# Patient Record
Sex: Female | Born: 1990 | Race: Black or African American | Hispanic: No | Marital: Single | Smoking: Former smoker
Health system: Southern US, Community
[De-identification: ages and names within clinical notes are randomized; demographics above are authoritative.]

## PROBLEM LIST (undated history)

## (undated) DIAGNOSIS — R87629 Unspecified abnormal cytological findings in specimens from vagina: Secondary | ICD-10-CM

## (undated) DIAGNOSIS — O139 Gestational [pregnancy-induced] hypertension without significant proteinuria, unspecified trimester: Secondary | ICD-10-CM

## (undated) HISTORY — PX: NO PAST SURGERIES: SHX2092

## (undated) HISTORY — DX: Unspecified abnormal cytological findings in specimens from vagina: R87.629

## (undated) HISTORY — DX: Gestational (pregnancy-induced) hypertension without significant proteinuria, unspecified trimester: O13.9

---
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
## 2010-06-18 LAB — POCT URINALYSIS DIP (DEVICE)
Bilirubin Urine: NEGATIVE
Glucose, UA: NEGATIVE mg/dL
Ketones, ur: NEGATIVE mg/dL
pH: 7 (ref 5.0–8.0)

## 2011-12-18 ENCOUNTER — Encounter (HOSPITAL_COMMUNITY): Payer: Self-pay | Admitting: Physical Medicine and Rehabilitation

## 2011-12-18 ENCOUNTER — Emergency Department (HOSPITAL_COMMUNITY)
Admission: EM | Admit: 2011-12-18 | Discharge: 2011-12-18 | Disposition: A | Discharge status: Home / Self Care | Payer: Self-pay | Attending: Emergency Medicine | Admitting: Emergency Medicine

## 2011-12-18 DIAGNOSIS — B9689 Other specified bacterial agents as the cause of diseases classified elsewhere: Secondary | ICD-10-CM | POA: Insufficient documentation

## 2011-12-18 DIAGNOSIS — N898 Other specified noninflammatory disorders of vagina: Secondary | ICD-10-CM | POA: Insufficient documentation

## 2011-12-18 DIAGNOSIS — A499 Bacterial infection, unspecified: Secondary | ICD-10-CM | POA: Insufficient documentation

## 2011-12-18 DIAGNOSIS — N939 Abnormal uterine and vaginal bleeding, unspecified: Secondary | ICD-10-CM

## 2011-12-18 DIAGNOSIS — N760 Acute vaginitis: Secondary | ICD-10-CM | POA: Insufficient documentation

## 2011-12-18 LAB — URINALYSIS, ROUTINE W REFLEX MICROSCOPIC
Leukocytes, UA: NEGATIVE
Nitrite: NEGATIVE
Specific Gravity, Urine: 1.027 (ref 1.005–1.030)
Urobilinogen, UA: 0.2 mg/dL (ref 0.0–1.0)

## 2011-12-18 LAB — POCT PREGNANCY, URINE: Preg Test, Ur: NEGATIVE

## 2011-12-18 LAB — WET PREP, GENITAL

## 2011-12-18 LAB — URINE MICROSCOPIC-ADD ON

## 2011-12-18 MED ORDER — METRONIDAZOLE 500 MG PO TABS: 500.0000 mg | ORAL_TABLET | Freq: Two times a day (BID) | ORAL | Status: DC

## 2011-12-18 NOTE — ED Notes (Signed)
Pt presents to department for evaluation of lower abdominal pain and vaginal bleeding. Ongoing x4 days. States vaginal bleeding with intercourse and spotting between periods. Denies pain at the time. Denies urinary symptoms. She is alert and oriented x4. No signs of acute distress at the time.

## 2011-12-18 NOTE — ED Provider Notes (Signed)
History     CSN: [id number]  Arrival date & time 12/18/11  1040   First MD Initiated Contact with Patient 12/18/11 1402      Chief Complaint  Patient presents with  . Abdominal Pain  . Vaginal Bleeding    (Consider location/radiation/quality/duration/timing/severity/associated sxs/prior treatment) HPI Comments: Pt with intermittent vaginal bleeding associated with urination or sexual intercourse.   Patient is a 21 y.o. female presenting with vaginal bleeding. The history is provided by the patient.  Vaginal Bleeding This is a new problem. The current episode started 1 to 4 weeks ago. The problem occurs 2 to 4 times per day. The problem has been unchanged. Pertinent negatives include no abdominal pain, anorexia, change in bowel habit, chest pain, coughing, fatigue, fever, headaches or nausea. Nothing aggravates the symptoms. She has tried nothing for the symptoms. The treatment provided no relief.    No past medical history on file.  No past surgical history on file.  No family history on file.  History  Substance Use Topics  . Smoking status: Never Smoker   . Smokeless tobacco: Not on file  . Alcohol Use: No    OB History    Grav Para Term Preterm Abortions TAB SAB Ect Mult Living                  Review of Systems  Constitutional: Negative for fever and fatigue.  Respiratory: Negative for cough and shortness of breath.   Cardiovascular: Negative for chest pain.  Gastrointestinal: Negative for nausea, abdominal pain, diarrhea, anorexia and change in bowel habit.  Genitourinary: Positive for vaginal bleeding and menstrual problem (missed period last month). Negative for vaginal discharge, vaginal pain and pelvic pain.  Neurological: Negative for headaches.  All other systems reviewed and are negative.    Allergies  Review of patient's allergies indicates no known allergies.  Home Medications  No current outpatient prescriptions on file.  BP 120/80  Pulse 60   Temp 98.1 F (36.7 C) (Oral)  Resp 16  SpO2 100%  Physical Exam  Nursing note and vitals reviewed. Constitutional: She is oriented to person, place, and time. She appears well-developed and well-nourished. No distress.  HENT:  Head: Normocephalic and atraumatic.  Eyes: EOM are normal. Pupils are equal, round, and reactive to light.  Neck: Normal range of motion.  Cardiovascular: Normal rate and normal heart sounds.   Pulmonary/Chest: Effort normal and breath sounds normal. No respiratory distress.  Abdominal: Soft. She exhibits no distension. There is no tenderness.  Genitourinary: There is bleeding (dark blood, appears old) around the vagina. No foreign body around the vagina. No signs of injury around the vagina.  Musculoskeletal: Normal range of motion.  Neurological: She is alert and oriented to person, place, and time.  Skin: Skin is warm and dry.    ED Course  Procedures (including critical care time)  Labs Reviewed  URINALYSIS, ROUTINE W REFLEX MICROSCOPIC - Abnormal; Notable for the following:    Hgb urine dipstick MODERATE (*)     All other components within normal limits  URINE MICROSCOPIC-ADD ON - Abnormal; Notable for the following:    Squamous Epithelial / LPF FEW (*)     All other components within normal limits  WET PREP, GENITAL - Abnormal; Notable for the following:    Clue Cells Wet Prep HPF POC MANY (*)     WBC, Wet Prep HPF POC MANY (*)     All other components within normal limits  POCT PREGNANCY, URINE  GC/CHLAMYDIA PROBE AMP, GENITAL   No results found.   1. Vaginal bleeding   2. Bacterial vaginosis       MDM  2:56 PM Pt seen and examined. Pt with intermittent spotting after missing her period last month. Pt denies pain or other symptoms. Pelvic without laceration or other obvious source for bleeding, though dark blood in vagina. Will send wet prep. Pt is not pregnant at this time.    4:01 PM Wet prep shows BV. Will treat. Pt advised to  follow with GYN to discuss her abnormal bleeding.     Daleen Bo, MD 12/18/11 [phone]

## 2011-12-18 NOTE — ED Notes (Signed)
Pt reporting last normal period was in august, has been spotting since then. Has had intermittent abdominal pain. Denying any pain at this time. A x 4. Reporting came in today to be evaluated for spotting and "abnormal periods".

## 2011-12-19 LAB — GC/CHLAMYDIA PROBE AMP, GENITAL
Chlamydia, DNA Probe: NEGATIVE
GC Probe Amp, Genital: NEGATIVE

## 2011-12-19 NOTE — ED Provider Notes (Signed)
I saw and evaluated the patient, reviewed the resident's note and I agree with the findings and plan.  Dysfunctional bleeding. Not pregnant. Abdomen benign. Home with tx for her BV  1. Vaginal bleeding   2. Bacterial vaginosis      Lyanne Co, MD 12/19/11 [phone]

## 2012-07-03 ENCOUNTER — Emergency Department (HOSPITAL_COMMUNITY)
Admission: EM | Admit: 2012-07-03 | Discharge: 2012-07-03 | Discharge status: Against Medical Advice | Payer: Self-pay | Attending: Emergency Medicine | Admitting: Emergency Medicine

## 2012-07-03 ENCOUNTER — Encounter (HOSPITAL_COMMUNITY): Payer: Self-pay

## 2012-07-03 DIAGNOSIS — R109 Unspecified abdominal pain: Secondary | ICD-10-CM | POA: Insufficient documentation

## 2012-07-03 DIAGNOSIS — Z3202 Encounter for pregnancy test, result negative: Secondary | ICD-10-CM | POA: Insufficient documentation

## 2012-07-03 DIAGNOSIS — N939 Abnormal uterine and vaginal bleeding, unspecified: Secondary | ICD-10-CM | POA: Insufficient documentation

## 2012-07-03 DIAGNOSIS — N926 Irregular menstruation, unspecified: Secondary | ICD-10-CM | POA: Insufficient documentation

## 2012-07-03 DIAGNOSIS — R112 Nausea with vomiting, unspecified: Secondary | ICD-10-CM | POA: Insufficient documentation

## 2012-07-03 DIAGNOSIS — F17200 Nicotine dependence, unspecified, uncomplicated: Secondary | ICD-10-CM | POA: Insufficient documentation

## 2012-07-03 LAB — CBC WITH DIFFERENTIAL/PLATELET
Basophils Absolute: 0 10*3/uL (ref 0.0–0.1)
Basophils Relative: 0 % (ref 0–1)
Eosinophils Relative: 1 % (ref 0–5)
Lymphocytes Relative: 22 % (ref 12–46)
MCHC: 36.3 g/dL — ABNORMAL HIGH (ref 30.0–36.0)
Monocytes Absolute: 0.4 10*3/uL (ref 0.1–1.0)
Neutro Abs: 4.2 10*3/uL (ref 1.7–7.7)
Platelets: 235 10*3/uL (ref 150–400)
RDW: 12.4 % (ref 11.5–15.5)
WBC: 6 10*3/uL (ref 4.0–10.5)

## 2012-07-03 LAB — COMPREHENSIVE METABOLIC PANEL
ALT: 13 U/L (ref 0–35)
AST: 20 U/L (ref 0–37)
Albumin: 3.8 g/dL (ref 3.5–5.2)
CO2: 27 mEq/L (ref 19–32)
Calcium: 9.6 mg/dL (ref 8.4–10.5)
Chloride: 103 mEq/L (ref 96–112)
Creatinine, Ser: 0.86 mg/dL (ref 0.50–1.10)
GFR calc non Af Amer: >90 mL/min (ref >90)
Sodium: 139 mEq/L (ref 135–145)

## 2012-07-03 LAB — URINALYSIS, ROUTINE W REFLEX MICROSCOPIC
Ketones, ur: NEGATIVE mg/dL
Leukocytes, UA: NEGATIVE
Protein, ur: NEGATIVE mg/dL
Urobilinogen, UA: 0.2 mg/dL (ref 0.0–1.0)

## 2012-07-03 LAB — WET PREP, GENITAL
Clue Cells Wet Prep HPF POC: NONE SEEN
Trich, Wet Prep: NONE SEEN

## 2012-07-03 MED ORDER — IBUPROFEN 800 MG PO TABS
800.0000 mg | ORAL_TABLET | Freq: Once | ORAL | Status: AC
  Administered 2012-07-03: 800 mg via ORAL
  Filled 2012-07-03: qty 1

## 2012-07-03 NOTE — ED Notes (Signed)
PA at bedside.

## 2012-07-03 NOTE — ED Provider Notes (Signed)
Medical screening examination/treatment/procedure(s) were performed by non-physician practitioner and as supervising physician I was immediately available for consultation/collaboration.   Lyanne Co, MD 07/03/12 [phone]

## 2012-07-03 NOTE — ED Provider Notes (Signed)
History     CSN: [id number]  Arrival date & time 07/03/12  1120   First MD Initiated Contact with Patient 07/03/12 1132      Chief Complaint  Patient presents with  . Abdominal Pain    (Consider location/radiation/quality/duration/timing/severity/associated sxs/prior treatment) HPI Comments: 22 year old female no significant past medical history presents emergency department complaining of gradual onset lower abdominal pain x2 days. Patient states last night after eating [place] food she began having non-radiating sharp lower abdominal pain rated 10/10. Admits to associated nausea with one episode of vomiting. After vomiting nausea subsided. She has not tried any alleviating factors for her pain, however over the night pain decreased to 5/10. Denies vaginal bleeding, discharge or pain, dysuria, hematuria, increased frequency or urgency, fever or chills. Last menstrual period was almost 2 months ago. She is not on any birth control pills and is sexually active with one partner.  Patient is a 22 y.o. female presenting with abdominal pain. The history is provided by the patient.  Abdominal Pain Associated symptoms: nausea and vomiting   Associated symptoms: no chills, no dysuria, no fever, no vaginal bleeding and no vaginal discharge     History reviewed. No pertinent past medical history.  History reviewed. No pertinent past surgical history.  No family history on file.  History  Substance Use Topics  . Smoking status: Current Some Day Smoker  . Smokeless tobacco: Not on file  . Alcohol Use: Yes     Comment: Occasional    OB History   Grav Para Term Preterm Abortions TAB SAB Ect Mult Living                  Review of Systems  Constitutional: Negative for fever, chills and appetite change.  Gastrointestinal: Positive for nausea, vomiting and abdominal pain.  Genitourinary: Positive for menstrual problem. Negative for dysuria, urgency, frequency, flank pain, decreased urine  volume, vaginal bleeding, vaginal discharge, vaginal pain and pelvic pain.  Musculoskeletal: Negative for back pain.  All other systems reviewed and are negative.    Allergies  Review of patient's allergies indicates no known allergies.  Home Medications   Current Outpatient Rx  Name  Route  Sig  Dispense  Refill  . metroNIDAZOLE (FLAGYL) 500 MG tablet   Oral   Take 1 tablet (500 mg total) by mouth 2 (two) times daily.   14 tablet   0     BP 138/68  Pulse 92  Temp(Src) 98.6 F (37 C) (Oral)  Resp 18  SpO2 96%  LMP 05/10/2012  Physical Exam  Nursing note and vitals reviewed. Constitutional: She is oriented to person, place, and time. She appears well-developed and well-nourished. No distress.  HENT:  Head: Normocephalic and atraumatic.  Mouth/Throat: Oropharynx is clear and moist.  Eyes: Conjunctivae are normal.  Neck: Normal range of motion. Neck supple.  Cardiovascular: Normal rate, regular rhythm and normal heart sounds.   Pulmonary/Chest: Effort normal and breath sounds normal. No respiratory distress.  Abdominal: Soft. Normal appearance and bowel sounds are normal. She exhibits no distension and no mass. There is tenderness in the suprapubic area and left lower quadrant. There is no rigidity, no rebound, no guarding and no CVA tenderness.  Genitourinary: Uterus normal. Uterus is not deviated, not enlarged, not fixed and not tender. Cervix exhibits discharge (clear). Cervix exhibits no motion tenderness and no friability. Right adnexum displays no mass, no tenderness and no fullness. Left adnexum displays tenderness. Left adnexum displays no mass and no fullness.  No erythema, tenderness or bleeding around the vagina. Vaginal discharge (clear) found.  Musculoskeletal: Normal range of motion. She exhibits no edema.  Neurological: She is alert and oriented to person, place, and time.  Skin: Skin is warm and dry. She is not diaphoretic.  Psychiatric: She has a normal mood  and affect. Her behavior is normal.    ED Course  Procedures (including critical care time)  Labs Reviewed  WET PREP, GENITAL - Abnormal; Notable for the following:    WBC, Wet Prep HPF POC TOO NUMEROUS TO COUNT (*)    All other components within normal limits  URINALYSIS, ROUTINE W REFLEX MICROSCOPIC - Abnormal; Notable for the following:    APPearance CLOUDY (*)    All other components within normal limits  CBC WITH DIFFERENTIAL - Abnormal; Notable for the following:    HCT 35.3 (*)    MCHC 36.3 (*)    All other components within normal limits  GC/CHLAMYDIA PROBE AMP  COMPREHENSIVE METABOLIC PANEL  POCT PREGNANCY, URINE   No results found.   1. Abdominal pain       MDM  Abdominal pain, LLQ, L adnexal tenderness. Probable ovarian cyst. Pelvic ultrasound ordered, however patient left AMA prior to ultrasound being obtained. Throughout entire evaluation patient was texting in NAD. Return precautions given prior to leaving.   Trevor Mace, PA-C 07/03/12 1421

## 2012-07-03 NOTE — ED Notes (Signed)
Pt presents to ER with c/o lower abdominal pain that began last night. She says she ate something last night that she doesn't normally eat. No urinary symptoms. Vomited once last night.

## 2012-07-04 LAB — GC/CHLAMYDIA PROBE AMP
CT Probe RNA: NEGATIVE
GC Probe RNA: NEGATIVE

## 2012-07-29 ENCOUNTER — Emergency Department (HOSPITAL_COMMUNITY)
Admission: EM | Admit: 2012-07-29 | Discharge: 2012-07-29 | Disposition: A | Discharge status: Home / Self Care | Payer: Self-pay | Attending: Emergency Medicine | Admitting: Emergency Medicine

## 2012-07-29 ENCOUNTER — Encounter (HOSPITAL_COMMUNITY): Payer: Self-pay | Admitting: Emergency Medicine

## 2012-07-29 DIAGNOSIS — F17200 Nicotine dependence, unspecified, uncomplicated: Secondary | ICD-10-CM | POA: Insufficient documentation

## 2012-07-29 DIAGNOSIS — N61 Mastitis without abscess: Secondary | ICD-10-CM | POA: Insufficient documentation

## 2012-07-29 MED ORDER — CEPHALEXIN 500 MG PO CAPS: 500.0000 mg | ORAL_CAPSULE | Freq: Four times a day (QID) | ORAL | Status: DC

## 2012-07-29 NOTE — ED Provider Notes (Signed)
History  This chart was scribed for non-physician practitioner Dierdre Forth, PA-C working with Dione Booze, MD, by Candelaria Stagers, ED Scribe. This patient was seen in room TR09C/TR09C and the patient's care was started at 6:11 PM   CSN: [id number]  Arrival date & time 07/29/12  1725   First MD Initiated Contact with Patient 07/29/12 1810      Chief Complaint  Patient presents with  . Breast Mass    The history is provided by the patient. No language interpreter was used.   HPI Comments: Felicia Edwards is a 22 y.o. female who presents to the Emergency Department complaining of a sore knot to the left nipple that she noticed three days ago.  Pt reports the knot has somewhat decreased in size, but is painful.  She states she has been using a heating pad which has helped with the pain and she believes the mass is decreasing in size.  Nothing seems to make it worse.  Pt denies currently being pregnant and is not breast feeding at this time.  Her OB/GYN is not in the area.  She denies fever, chills, nausea, vomiting, or drainage from the area.     History reviewed. No pertinent past medical history.  History reviewed. No pertinent past surgical history.  History reviewed. No pertinent family history.  History  Substance Use Topics  . Smoking status: Current Some Day Smoker  . Smokeless tobacco: Not on file  . Alcohol Use: Yes     Comment: Occasional    OB History   Grav Para Term Preterm Abortions TAB SAB Ect Mult Living                  Review of Systems  Skin:       Sore knot to the left nipple  All other systems reviewed and are negative.    Allergies  Review of patient's allergies indicates no known allergies.  Home Medications   Current Outpatient Rx  Name  Route  Sig  Dispense  Refill  . cephALEXin (KEFLEX) 500 MG capsule   Oral   Take 1 capsule (500 mg total) by mouth 4 (four) times daily.   40 capsule   0     BP 128/75  Pulse 85  Temp(Src)  98.2 F (36.8 C) (Oral)  Resp 16  SpO2 99%  LMP 05/10/2012  Physical Exam  Nursing note and vitals reviewed. Constitutional: She appears well-developed and well-nourished. No distress.  HENT:  Head: Normocephalic and atraumatic.  Mouth/Throat: Oropharynx is clear and moist. No oropharyngeal exudate.  Eyes: Conjunctivae are normal. No scleral icterus.  Neck: Normal range of motion. Neck supple.  Cardiovascular: Normal rate, regular rhythm and intact distal pulses.   Pulmonary/Chest: Effort normal and breath sounds normal. No respiratory distress. She has no wheezes.  Abdominal: Soft. Bowel sounds are normal. She exhibits no mass. There is no tenderness. There is no rebound and no guarding.  Musculoskeletal: Normal range of motion. She exhibits no edema.  Lymphadenopathy:       Right cervical: No superficial cervical, no deep cervical and no posterior cervical adenopathy present.      Left cervical: No superficial cervical, no deep cervical and no posterior cervical adenopathy present.    She has no axillary adenopathy.       Right axillary: No pectoral and no lateral adenopathy present.       Left axillary: No pectoral and no lateral adenopathy present.      Right: No  supraclavicular adenopathy present.       Left: No supraclavicular adenopathy present.  Neurological: She is alert.  Speech is clear and goal oriented Moves extremities without ataxia  Skin: Skin is warm and dry. She is not diaphoretic.  Palpable induration to the medial side of the left breast located in the region of the areola not extending deep into the breast tissue or into the nipple.  No erythema.  Some increased warmth to the area.  NO area of fluctuance  Psychiatric: She has a normal mood and affect.    ED Course  Procedures   DIAGNOSTIC STUDIES: Oxygen Saturation is 99% on room air, normal by my interpretation.    COORDINATION OF CARE:  6:14 PM Discussed course of care with pt which includes antibiotic  and will provide resources for OBGYN follow up.  Strict return precautions discussed.  Pt understands and agrees.   Labs Reviewed - No data to display No results found.   1. Mastitis in female       MDM  Felicia Edwards presents with history and physical consistent with mastitis that the patient is not breast-feeding. No area of fluctuance for which I believe I&D would be beneficial. No axillary lymphadenopathy palpable, afebrile, not tachycardic, nontoxic, nonseptic appearing.  Patient has no history of diabetes, HIV or steroid use.  Patient will be treated with Keflex and encouraged to continue warm compresses. Also recommended that she followup with OB/GYN for reevaluation in one week. I have also discussed reasons to return immediately to the ER.  Patient expresses understanding and agrees with plan.  I personally performed the services described in this documentation, which was scribed in my presence. The recorded information has been reviewed and is accurate.   Dierdre Forth, PA-C 07/29/12 1839

## 2012-07-29 NOTE — ED Notes (Signed)
Pt sts "knot" on left nipple with pain x 3 days

## 2012-07-30 NOTE — ED Provider Notes (Signed)
Medical screening examination/treatment/procedure(s) were performed by non-physician practitioner and as supervising physician I was immediately available for consultation/collaboration.   Dione Booze, MD 07/30/12 [phone]

## 2012-10-09 ENCOUNTER — Emergency Department (HOSPITAL_COMMUNITY)
Admission: EM | Admit: 2012-10-09 | Discharge: 2012-10-09 | Disposition: A | Discharge status: Home / Self Care | Payer: Self-pay | Attending: Emergency Medicine | Admitting: Emergency Medicine

## 2012-10-09 ENCOUNTER — Encounter (HOSPITAL_COMMUNITY): Payer: Self-pay | Admitting: Emergency Medicine

## 2012-10-09 DIAGNOSIS — Z3202 Encounter for pregnancy test, result negative: Secondary | ICD-10-CM | POA: Insufficient documentation

## 2012-10-09 DIAGNOSIS — F17200 Nicotine dependence, unspecified, uncomplicated: Secondary | ICD-10-CM | POA: Insufficient documentation

## 2012-10-09 DIAGNOSIS — R300 Dysuria: Secondary | ICD-10-CM | POA: Insufficient documentation

## 2012-10-09 DIAGNOSIS — A6000 Herpesviral infection of urogenital system, unspecified: Secondary | ICD-10-CM | POA: Insufficient documentation

## 2012-10-09 LAB — URINALYSIS, ROUTINE W REFLEX MICROSCOPIC
Bilirubin Urine: NEGATIVE
Glucose, UA: NEGATIVE mg/dL
Hgb urine dipstick: NEGATIVE
Ketones, ur: NEGATIVE mg/dL
Nitrite: NEGATIVE
Protein, ur: NEGATIVE mg/dL
Specific Gravity, Urine: 1.026 (ref 1.005–1.030)
Urobilinogen, UA: 1 mg/dL (ref 0.0–1.0)
pH: 7 (ref 5.0–8.0)

## 2012-10-09 LAB — URINE MICROSCOPIC-ADD ON

## 2012-10-09 MED ORDER — VALACYCLOVIR HCL 1 G PO TABS: 1000.0000 mg | ORAL_TABLET | Freq: Two times a day (BID) | ORAL | Status: AC

## 2012-10-09 NOTE — ED Notes (Signed)
PT. REPORTS VAGINAL IRRITATION /ITCHING AFTER APPLYING FRAGRANCE POWDER AFTER SHAVING , ALSO REPORTS " BURNING " WHEN URINATING .

## 2012-10-09 NOTE — ED Provider Notes (Signed)
CSN: [id number]     Arrival date & time 10/09/12  1910 History  This chart was scribed for non-physician practitioner Felicia Forth, PA-C, working with Felicia Edwards. Felicia Lamas, MD, by Felicia Edwards, ED Scribe. This patient was seen in room TR11C/TR11C and the patient's care was started at 9:36 PM.   First MD Initiated Contact with Patient 10/09/12 2111     Chief Complaint  Patient presents with  . Vaginal Itching    The history is provided by the patient. No language interpreter was used.   HPI Comments: Felicia Edwards is a 22 y.o. female who presents to the Emergency Department complaining of irritation to her vagina which began a week ago. She states that the symptoms worsened two days ago. Last week she applied a fragrance powder after shaving.  She reports that she normally shaves, but the fragrance powder was new. The pt reports experiencing vaginal itching and dysuria as associated symptoms. She denies experiencing any vaginal discharge and vaginal bleeding. She also denies experiencing any fever, chills, nausea, or emesis.  She has a h/o STD including chlamydia for which she has been treated. The pt states she has had two sexual partners within the last six months. The pt denies taking any medication on a daily basis.   History reviewed. No pertinent past medical history. History reviewed. No pertinent past surgical history. No family history on file. History  Substance Use Topics  . Smoking status: Current Some Day Smoker  . Smokeless tobacco: Not on file  . Alcohol Use: Yes     Comment: Occasional    No OB history provided.  Review of Systems  Constitutional: Negative for fever and chills.  Gastrointestinal: Negative for nausea and vomiting.  Genitourinary: Positive for dysuria (Burning.). Negative for vaginal bleeding and vaginal discharge.       Vaginal itching.   All other systems reviewed and are negative.    Allergies  Review of patient's allergies indicates no  known allergies.  Home Medications   Current Outpatient Rx  Name  Route  Sig  Dispense  Refill  . valACYclovir (VALTREX) 1000 MG tablet   Oral   Take 1 tablet (1,000 mg total) by mouth 2 (two) times daily.   20 tablet   0     Triage Vitals: BP 144/85  Pulse 105  Temp(Src) 99.3 F (37.4 C) (Oral)  Resp 14  SpO2 100%  LMP 09/24/2012  Physical Exam  Nursing note and vitals reviewed. Constitutional: She appears well-developed and well-nourished. No distress.  Awake, alert, nontoxic appearance  HENT:  Head: Normocephalic and atraumatic.  Mouth/Throat: Oropharynx is clear and moist. No oropharyngeal exudate.  Eyes: Conjunctivae are normal. No scleral icterus.  Neck: Normal range of motion. Neck supple.  Cardiovascular: Normal rate, regular rhythm and intact distal pulses.   Pulmonary/Chest: Effort normal and breath sounds normal. No respiratory distress. She has no wheezes.  Abdominal: Soft. Bowel sounds are normal. She exhibits no mass. There is no tenderness. There is no rebound and no guarding.  Genitourinary:    There is rash, tenderness and lesion on the right labia. There is no injury on the right labia. There is rash, tenderness and lesion on the left labia. There is no injury on the left labia.  Multiple ulcerated lesions of the vulva with erythema and significant tenderness to palpation, no induration or fluctuance, no evidence of abscess No urticaria  Musculoskeletal: Normal range of motion. She exhibits no edema.  Lymphadenopathy:  Right: Inguinal adenopathy present.       Left: Inguinal adenopathy present.  Bilateral mild inguinal adenopathy  Neurological: She is alert.  Speech is clear and goal oriented Moves extremities without ataxia  Skin: Skin is warm and dry. She is not diaphoretic.  Psychiatric: She has a normal mood and affect.    ED Course   DIAGNOSTIC STUDIES: Oxygen Saturation is 100% on room air, normal by my interpretation.     COORDINATION OF CARE:  9:40 PM-Discussed treatment plan with patient which includes a prescription for Valtrex for herpes and education about herpes, and the patient agreed to the plan.   Procedures (including critical care time)  Labs Reviewed  URINALYSIS, ROUTINE W REFLEX MICROSCOPIC - Abnormal; Notable for the following:    Leukocytes, UA SMALL (*)    All other components within normal limits  URINE MICROSCOPIC-ADD ON - Abnormal; Notable for the following:    Squamous Epithelial / LPF FEW (*)    Bacteria, UA FEW (*)    All other components within normal limits  URINE CULTURE  WET PREP, GENITAL  GC/CHLAMYDIA PROBE AMP  POCT PREGNANCY, URINE   No results found. 1. 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  Urinalysis without evidence of urinary tract infection and pregnancy test negative. No pallor performed as patient does not have vaginal discharge or bleeding.  Patient's consistent with genital herpes. We'll treat with Valtrex and recommend followup with OB/GYN.  I have also discussed reasons to return immediately to the ER.  Patient expresses understanding and agrees with plan.  I personally performed the services described in this documentation, which was scribed in my presence. The recorded information has been reviewed and is accurate.   Felicia Client Larren Copes, PA-C 10/09/12 2211

## 2012-10-10 NOTE — ED Provider Notes (Signed)
Medical screening examination/treatment/procedure(s) were performed by non-physician practitioner and as supervising physician I was immediately available for consultation/collaboration.   Felicia Edwards. Trevelle Mcgurn, MD 10/10/12 1454

## 2012-10-11 LAB — URINE CULTURE: Colony Count: NO GROWTH

## 2014-08-26 ENCOUNTER — Encounter (HOSPITAL_COMMUNITY): Payer: Self-pay | Admitting: Family Medicine

## 2014-08-26 ENCOUNTER — Emergency Department (HOSPITAL_COMMUNITY)
Admission: EM | Admit: 2014-08-26 | Discharge: 2014-08-26 | Disposition: A | Discharge status: Home / Self Care | Payer: Self-pay | Attending: Emergency Medicine | Admitting: Emergency Medicine

## 2014-08-26 DIAGNOSIS — Z720 Tobacco use: Secondary | ICD-10-CM | POA: Insufficient documentation

## 2014-08-26 DIAGNOSIS — A599 Trichomoniasis, unspecified: Secondary | ICD-10-CM

## 2014-08-26 DIAGNOSIS — A5901 Trichomonal vulvovaginitis: Secondary | ICD-10-CM | POA: Insufficient documentation

## 2014-08-26 DIAGNOSIS — Z3202 Encounter for pregnancy test, result negative: Secondary | ICD-10-CM | POA: Insufficient documentation

## 2014-08-26 DIAGNOSIS — N939 Abnormal uterine and vaginal bleeding, unspecified: Secondary | ICD-10-CM

## 2014-08-26 LAB — CBC WITH DIFFERENTIAL/PLATELET
BASOS PCT: 0 % (ref 0–1)
Basophils Absolute: 0 10*3/uL (ref 0.0–0.1)
Eosinophils Absolute: 0.1 10*3/uL (ref 0.0–0.7)
Eosinophils Relative: 2 % (ref 0–5)
HEMATOCRIT: 36.9 % (ref 36.0–46.0)
HEMOGLOBIN: 12.8 g/dL (ref 12.0–15.0)
LYMPHS ABS: 1.8 10*3/uL (ref 0.7–4.0)
LYMPHS PCT: 47 % — AB (ref 12–46)
MCH: 31.4 pg (ref 26.0–34.0)
MCHC: 34.7 g/dL (ref 30.0–36.0)
MCV: 90.7 fL (ref 78.0–100.0)
MONO ABS: 0.3 10*3/uL (ref 0.1–1.0)
MONOS PCT: 9 % (ref 3–12)
NEUTROS ABS: 1.6 10*3/uL — AB (ref 1.7–7.7)
NEUTROS PCT: 42 % — AB (ref 43–77)
Platelets: 200 10*3/uL (ref 150–400)
RBC: 4.07 MIL/uL (ref 3.87–5.11)
RDW: 12.4 % (ref 11.5–15.5)
WBC: 3.8 10*3/uL — ABNORMAL LOW (ref 4.0–10.5)

## 2014-08-26 LAB — URINALYSIS, ROUTINE W REFLEX MICROSCOPIC
BILIRUBIN URINE: NEGATIVE
GLUCOSE, UA: NEGATIVE mg/dL
Ketones, ur: NEGATIVE mg/dL
LEUKOCYTES UA: NEGATIVE
NITRITE: NEGATIVE
Protein, ur: NEGATIVE mg/dL
SPECIFIC GRAVITY, URINE: 1.031 — AB (ref 1.005–1.030)
UROBILINOGEN UA: 0.2 mg/dL (ref 0.0–1.0)
pH: 6 (ref 5.0–8.0)

## 2014-08-26 LAB — URINE MICROSCOPIC-ADD ON

## 2014-08-26 LAB — WET PREP, GENITAL: Yeast Wet Prep HPF POC: NONE SEEN

## 2014-08-26 LAB — POC URINE PREG, ED: Preg Test, Ur: NEGATIVE

## 2014-08-26 MED ORDER — METRONIDAZOLE 500 MG PO TABS: 500.0000 mg | ORAL_TABLET | Freq: Two times a day (BID) | ORAL | Status: DC

## 2014-08-26 NOTE — ED Provider Notes (Signed)
CSN: [id number]     Arrival date & time 08/26/14  0747 History   First MD Initiated Contact with Patient 08/26/14 [phone]     Chief Complaint  Patient presents with  . Abdominal Pain  . Vaginal Bleeding     (Consider location/radiation/quality/duration/timing/severity/associated sxs/prior Treatment) HPI Comments: Patient presents today with a chief complaint of vaginal bleeding.  She describes the bleeding as spotting.  Bleeding has been occurring over the past week.  She has a history of the same and was seen in the ED in 2013 for similar presentation.  She also reports mild lower abdominal cramping intermittently over the past week.  No abdominal pain or cramping at this time.  She denies nausea, vomiting, fever, chills, dizziness, lightheadedness, syncope, chest pain, or SOB.  She also denies any urinary symptoms.  She is sexually active.  Denies any recent trauma.  She is currently not on any contraceptives.  She does not have an OB/GYN.  She reports that her last menstrual period was mid May, but states that this bleeding is different than her normal menstrual bleeding.  Patient is a 24 y.o. female presenting with abdominal pain and vaginal bleeding. The history is provided by the patient.  Abdominal Pain Associated symptoms: vaginal bleeding   Vaginal Bleeding Associated symptoms: abdominal pain     History reviewed. No pertinent past medical history. History reviewed. No pertinent past surgical history. History reviewed. No pertinent family history. History  Substance Use Topics  . Smoking status: Current Some Day Smoker    Types: Cigarettes  . Smokeless tobacco: Not on file  . Alcohol Use: Yes     Comment: Twice every two weeks.    OB History    No data available     Review of Systems  Gastrointestinal: Positive for abdominal pain.  Genitourinary: Positive for vaginal bleeding.  All other systems reviewed and are negative.     Allergies  Review of patient's allergies  indicates no known allergies.  Home Medications   Prior to Admission medications   Not on File   BP 147/87 mmHg  Pulse 70  Temp(Src) 98.7 F (37.1 C) (Oral)  Resp 18  Ht  (1.6 m)  Wt 106 lb (48.081 kg)  BMI 18.78 kg/m2  SpO2 100%  LMP  Physical Exam  Constitutional: She appears well-developed and well-nourished.  HENT:  Head: Normocephalic and atraumatic.  Cardiovascular: Normal rate, regular rhythm and normal heart sounds.   Pulmonary/Chest: Effort normal and breath sounds normal.  Abdominal: Soft. Bowel sounds are normal. She exhibits no distension and no mass. There is no tenderness. There is no rebound and no guarding.  Genitourinary: Cervix exhibits no motion tenderness. Right adnexum displays no mass, no tenderness and no fullness. Left adnexum displays no mass, no tenderness and no fullness.  Small amount of dark red blood in the vaginal vault.  No clots.  Musculoskeletal: Normal range of motion.  Neurological: She is alert.  Skin: Skin is warm and dry.  Psychiatric: She has a normal mood and affect.  Nursing note and vitals reviewed.   ED Course  Procedures (including critical care time) Labs Review Labs Reviewed  WET PREP, GENITAL  URINALYSIS, ROUTINE W REFLEX MICROSCOPIC (NOT AT [facility])  CBC WITH DIFFERENTIAL/PLATELET  POC URINE PREG, ED  GC/CHLAMYDIA PROBE AMP ([place]) NOT AT [facility]    Imaging Review No results found.   EKG Interpretation None      MDM   Final diagnoses:  None  Patient presents today with a chief complaint of vaginal bleeding.  She describes the bleeding as spotting for the past week.  VSS.  Labs including hemoglobin unremarkable.  Urine pregnancy is negative.  No CMT or adnexal tenderness with pelvic exam.  Abdomen soft and nontender.  Therefore, do not feel that any imaging is indicated.  Wet prep is positive for Trich.  Patient started on Flagyl.  Stable for discharge.  Given referral to OB/GYN.  Return precautions  given.    Santiago Glad, PA-C 08/26/14 1552  Mancel Bale, MD 08/26/14 [phone]

## 2014-08-26 NOTE — ED Notes (Signed)
Patient states she is experiencing lower abd pain with vaginal bleeding since last Thursday. Denies nausea, vomiting, diarrhea, or fever.

## 2014-08-27 LAB — GC/CHLAMYDIA PROBE AMP (~~LOC~~) NOT AT ARMC
CHLAMYDIA, DNA PROBE: NEGATIVE
NEISSERIA GONORRHEA: NEGATIVE

## 2014-08-28 LAB — URINE CULTURE

## 2014-09-15 ENCOUNTER — Encounter (HOSPITAL_COMMUNITY): Payer: Self-pay

## 2014-09-15 ENCOUNTER — Emergency Department (HOSPITAL_COMMUNITY)
Admission: EM | Admit: 2014-09-15 | Discharge: 2014-09-16 | Disposition: A | Discharge status: Home / Self Care | Payer: Self-pay | Attending: Emergency Medicine | Admitting: Emergency Medicine

## 2014-09-15 DIAGNOSIS — B373 Candidiasis of vulva and vagina: Secondary | ICD-10-CM | POA: Insufficient documentation

## 2014-09-15 DIAGNOSIS — Z3202 Encounter for pregnancy test, result negative: Secondary | ICD-10-CM | POA: Insufficient documentation

## 2014-09-15 DIAGNOSIS — B3731 Acute candidiasis of vulva and vagina: Secondary | ICD-10-CM

## 2014-09-15 DIAGNOSIS — N390 Urinary tract infection, site not specified: Secondary | ICD-10-CM | POA: Insufficient documentation

## 2014-09-15 DIAGNOSIS — Z720 Tobacco use: Secondary | ICD-10-CM | POA: Insufficient documentation

## 2014-09-15 LAB — URINE MICROSCOPIC-ADD ON

## 2014-09-15 LAB — URINALYSIS, ROUTINE W REFLEX MICROSCOPIC
BILIRUBIN URINE: NEGATIVE
Glucose, UA: NEGATIVE mg/dL
HGB URINE DIPSTICK: NEGATIVE
KETONES UR: NEGATIVE mg/dL
NITRITE: NEGATIVE
Protein, ur: NEGATIVE mg/dL
SPECIFIC GRAVITY, URINE: 1.026 (ref 1.005–1.030)
UROBILINOGEN UA: 0.2 mg/dL (ref 0.0–1.0)
pH: 6.5 (ref 5.0–8.0)

## 2014-09-15 LAB — PREGNANCY, URINE: Preg Test, Ur: NEGATIVE

## 2014-09-15 NOTE — ED Notes (Signed)
Pt was seen here two weeks ago and treated for trich, she took all the medication and now she states that she feels irritated and thinks she has a yeast infection, she used a scented soap and body spray

## 2014-09-16 LAB — GC/CHLAMYDIA PROBE AMP (~~LOC~~) NOT AT ARMC
CHLAMYDIA, DNA PROBE: NEGATIVE
Neisseria Gonorrhea: NEGATIVE

## 2014-09-16 LAB — WET PREP, GENITAL
Clue Cells Wet Prep HPF POC: NONE SEEN
Trich, Wet Prep: NONE SEEN

## 2014-09-16 MED ORDER — AZITHROMYCIN 250 MG PO TABS
1000.0000 mg | ORAL_TABLET | Freq: Once | ORAL | Status: AC
  Administered 2014-09-16: 1000 mg via ORAL
  Filled 2014-09-16: qty 4

## 2014-09-16 MED ORDER — CEFTRIAXONE SODIUM 250 MG IJ SOLR
250.0000 mg | Freq: Once | INTRAMUSCULAR | Status: AC
  Administered 2014-09-16: 250 mg via INTRAMUSCULAR
  Filled 2014-09-16: qty 250

## 2014-09-16 MED ORDER — ONDANSETRON 4 MG PO TBDP
4.0000 mg | ORAL_TABLET | Freq: Once | ORAL | Status: AC
  Administered 2014-09-16: 4 mg via ORAL
  Filled 2014-09-16: qty 1

## 2014-09-16 MED ORDER — LIDOCAINE HCL 1 % IJ SOLN
INTRAMUSCULAR | Status: AC
  Filled 2014-09-16: qty 20

## 2014-09-16 MED ORDER — FLUCONAZOLE 200 MG PO TABS: 200.0000 mg | ORAL_TABLET | Freq: Every day | ORAL | Status: AC

## 2014-09-16 NOTE — ED Provider Notes (Signed)
CSN: [id number]     Arrival date & time 09/15/14  1917 History   First MD Initiated Contact with Patient 09/15/14 2310     Chief Complaint  Patient presents with  . Vaginitis     (Consider location/radiation/quality/duration/timing/severity/associated sxs/prior Treatment) HPI  SD is an otherwise healthy 24 y/o female who presents to the ED today with vaginal itching and thick white vaginal discharge x 3 days. She was recently seen on 6/16 and treated for trichomonas with flagyl, which she took as directed and completed on 6/22. She attributes the discharge to using a new soap. She denies having any pain, dysuria, frequency, urgency, fever, nausea, vomiting, or vaginal bleeding.   PCP: Pcp Not In System Blood pressure 120/80, pulse 62, temperature 98.4 F (36.9 C), temperature source Oral, resp. rate 20, height  (1.6 m), weight 155 lb (70.308 kg), last menstrual period 08/25/2014, SpO2 100 %.  . The patient denies diaphoresis, fever, headache, weakness (general or focal), confusion, change of vision,  neck pain, dysphagia, aphagia, chest pain, shortness of breath,  back pain, abdominal pains, nausea, vomiting, diarrhea, lower extremity swelling, rash.    History reviewed. No pertinent past medical history. History reviewed. No pertinent past surgical history. History reviewed. No pertinent family history. History  Substance Use Topics  . Smoking status: Current Some Day Smoker    Types: Cigarettes  . Smokeless tobacco: Not on file  . Alcohol Use: Yes     Comment: Twice every two weeks.    OB History    No data available     Review of Systems  10 Systems reviewed and are negative for acute change except as noted in the HPI.     Allergies  Review of patient's allergies indicates no known allergies.  Home Medications   Prior to Admission medications   Medication Sig Start Date End Date Taking? Authorizing Provider  fluconazole (DIFLUCAN) 200 MG tablet Take 1 tablet  (200 mg total) by mouth daily. 09/16/14 09/23/14  Alycea Segoviano Neva Seat, PA-C  metroNIDAZOLE (FLAGYL) 500 MG tablet Take 1 tablet (500 mg total) by mouth 2 (two) times daily. Patient not taking: Reported on 09/15/2014 08/26/14   Heather Laisure, PA-C   BP 120/80 mmHg  Pulse 62  Temp(Src) 98.4 F (36.9 C) (Oral)  Resp 20  Ht  (1.6 m)  Wt 155 lb (70.308 kg)  BMI 27.46 kg/m2  SpO2 100%  LMP 08/25/2014 Physical Exam  Constitutional: She appears well-developed and well-nourished. No distress.  HENT:  Head: Normocephalic and atraumatic.  Eyes: Pupils are equal, round, and reactive to light.  Neck: Normal range of motion. Neck supple.  Cardiovascular: Normal rate and regular rhythm.   Pulmonary/Chest: Effort normal.  Abdominal: Soft. Bowel sounds are normal. There is no tenderness. There is no rigidity, no rebound and no guarding.  Genitourinary: Uterus normal. Cervix exhibits no motion tenderness, no discharge and no friability. There is tenderness in the vagina. No bleeding in the vagina. No foreign body around the vagina.  Neurological: She is alert.  Skin: Skin is warm and dry.  Nursing note and vitals reviewed.   ED Course  Procedures (including critical care time) Labs Review Labs Reviewed  WET PREP, GENITAL - Abnormal; Notable for the following:    Yeast Wet Prep HPF POC FEW (*)    WBC, Wet Prep HPF POC MANY (*)    All other components within normal limits  URINALYSIS, ROUTINE W REFLEX MICROSCOPIC (NOT AT [facility]) - Abnormal; Notable for the  following:    APPearance CLOUDY (*)    Leukocytes, UA LARGE (*)    All other components within normal limits  URINE MICROSCOPIC-ADD ON - Abnormal; Notable for the following:    Squamous Epithelial / LPF MANY (*)    Bacteria, UA FEW (*)    All other components within normal limits  URINE CULTURE  PREGNANCY, URINE  GC/CHLAMYDIA PROBE AMP ([place]) NOT AT [facility]    Imaging Review No results found.   EKG Interpretation None      MDM    Final diagnoses:  UTI (lower urinary tract infection)  Vaginal yeast infection    Yeast and many WBC on wet prep, large leukocytes on urinalysis. Concern with urine being contaminated by vaginal discharge, will send out culture- she is not having dysuria.  Rx: Diflucan  Medications  cefTRIAXone (ROCEPHIN) injection 250 mg (250 mg Intramuscular Given 09/16/14 0100)  azithromycin (ZITHROMAX) tablet 1,000 mg (1,000 mg Oral Given 09/16/14 0101)  ondansetron (ZOFRAN-ODT) disintegrating tablet 4 mg (4 mg Oral Given 09/16/14 0101)    23 y.o.Felicia Edwards's evaluation in the Emergency Department is complete. It has been determined that no acute conditions requiring further emergency intervention are present at this time. The patient/guardian have been advised of the diagnosis and plan. We have discussed signs and symptoms that warrant return to the ED, such as changes or worsening in symptoms.  Vital signs are stable at discharge. Filed Vitals:   09/15/14 2356  BP: 120/80  Pulse: 62  Temp: 98.4 F (36.9 C)  Resp: 20    Patient/guardian has voiced understanding and agreed to follow-up with the PCP or specialist.     Marlon Peliffany Lerlene Treadwell, PA-C 09/18/14 1509  Loren Raceravid Yelverton, MD 09/19/14 [phone]

## 2014-09-16 NOTE — Discharge Instructions (Signed)
Candidal Vulvovaginitis °Candidal vulvovaginitis is an infection of the vagina and vulva. The vulva is the skin around the opening of the vagina. This may cause itching and discomfort in and around the vagina.  °HOME CARE °· Only take medicine as told by your doctor. °· Do not have sex (intercourse) until the infection is healed or as told by your doctor. °· Practice safe sex. °· Tell your sex partner about your infection. °· Do not douche or use tampons. °· Wear cotton underwear. Do not wear tight pants or panty hose. °· Eat yogurt. This may help treat and prevent yeast infections. °GET HELP RIGHT AWAY IF:  °· You have a fever. °· Your problems get worse during treatment or do not get better in 3 days. °· You have discomfort, irritation, or itching in your vagina or vulva area. °· You have pain after sex. °· You start to get belly (abdominal) pain. °MAKE SURE YOU: °· Understand these instructions. °· Will watch your condition. °· Will get help right away if you are not doing well or get worse. °Document Released: 05/25/2008 Document Revised: 03/03/2013 Document Reviewed: 05/25/2008 °ExitCare® Patient Information ©2015 [employer]. This information is not intended to replace advice given to you by your health care provider. Make sure you discuss any questions you have with your health care provider. ° °Urinary Tract Infection °Urinary tract infections (UTIs) can develop anywhere along your urinary tract. Your urinary tract is your body's drainage system for removing wastes and extra water. Your urinary tract includes two kidneys, two ureters, a bladder, and a urethra. Your kidneys are a pair of bean-shaped organs. Each kidney is about the size of your fist. They are located below your ribs, one on each side of your spine. °CAUSES °Infections are caused by microbes, which are microscopic organisms, including fungi, viruses, and bacteria. These organisms are so small that they can only be seen through a microscope.  Bacteria are the microbes that most commonly cause UTIs. °SYMPTOMS  °Symptoms of UTIs may vary by age and gender of the patient and by the location of the infection. Symptoms in young women typically include a frequent and intense urge to urinate and a painful, burning feeling in the bladder or urethra during urination. Older women and men are more likely to be tired, shaky, and weak and have muscle aches and abdominal pain. A fever may mean the infection is in your kidneys. Other symptoms of a kidney infection include pain in your back or sides below the ribs, nausea, and vomiting. °DIAGNOSIS °To diagnose a UTI, your caregiver will ask you about your symptoms. Your caregiver also will ask to provide a urine sample. The urine sample will be tested for bacteria and white blood cells. White blood cells are made by your body to help fight infection. °TREATMENT  °Typically, UTIs can be treated with medication. Because most UTIs are caused by a bacterial infection, they usually can be treated with the use of antibiotics. The choice of antibiotic and length of treatment depend on your symptoms and the type of bacteria causing your infection. °HOME CARE INSTRUCTIONS °· If you were prescribed antibiotics, take them exactly as your caregiver instructs you. Finish the medication even if you feel better after you have only taken some of the medication. °· Drink enough water and fluids to keep your urine clear or pale yellow. °· Avoid caffeine, tea, and carbonated beverages. They tend to irritate your bladder. °· Empty your bladder often. Avoid holding urine for   long periods of time.  Empty your bladder before and after sexual intercourse.  After a bowel movement, women should cleanse from front to back. Use each tissue only once. SEEK MEDICAL CARE IF:   You have back pain.  You develop a fever.  Your symptoms do not begin to resolve within 3 days. SEEK IMMEDIATE MEDICAL CARE IF:   You have severe back pain or  lower abdominal pain.  You develop chills.  You have nausea or vomiting.  You have continued burning or discomfort with urination. MAKE SURE YOU:   Understand these instructions.  Will watch your condition.  Will get help right away if you are not doing well or get worse. Document Released: 12/06/2004 Document Revised: 08/28/2011 Document Reviewed: 04/06/2011 [facility] Patient Information 2015 [place], [place]. This information is not intended to replace advice given to you by your health care provider. Make sure you discuss any questions you have with your health care provider.

## 2014-09-17 LAB — URINE CULTURE

## 2014-10-13 ENCOUNTER — Emergency Department (HOSPITAL_COMMUNITY)
Admission: EM | Admit: 2014-10-13 | Discharge: 2014-10-13 | Disposition: A | Discharge status: Home / Self Care | Payer: No Typology Code available for payment source | Attending: Emergency Medicine | Admitting: Emergency Medicine

## 2014-10-13 ENCOUNTER — Emergency Department (HOSPITAL_COMMUNITY): Payer: No Typology Code available for payment source

## 2014-10-13 ENCOUNTER — Encounter (HOSPITAL_COMMUNITY): Payer: Self-pay | Admitting: Emergency Medicine

## 2014-10-13 DIAGNOSIS — Z3202 Encounter for pregnancy test, result negative: Secondary | ICD-10-CM | POA: Diagnosis not present

## 2014-10-13 DIAGNOSIS — Y998 Other external cause status: Secondary | ICD-10-CM | POA: Insufficient documentation

## 2014-10-13 DIAGNOSIS — Z720 Tobacco use: Secondary | ICD-10-CM | POA: Insufficient documentation

## 2014-10-13 DIAGNOSIS — S4992XA Unspecified injury of left shoulder and upper arm, initial encounter: Secondary | ICD-10-CM | POA: Insufficient documentation

## 2014-10-13 DIAGNOSIS — S199XXA Unspecified injury of neck, initial encounter: Secondary | ICD-10-CM | POA: Diagnosis not present

## 2014-10-13 DIAGNOSIS — Y92410 Unspecified street and highway as the place of occurrence of the external cause: Secondary | ICD-10-CM | POA: Diagnosis not present

## 2014-10-13 DIAGNOSIS — Y9389 Activity, other specified: Secondary | ICD-10-CM | POA: Diagnosis not present

## 2014-10-13 DIAGNOSIS — M549 Dorsalgia, unspecified: Secondary | ICD-10-CM

## 2014-10-13 DIAGNOSIS — M79602 Pain in left arm: Secondary | ICD-10-CM

## 2014-10-13 DIAGNOSIS — S299XXA Unspecified injury of thorax, initial encounter: Secondary | ICD-10-CM | POA: Diagnosis not present

## 2014-10-13 LAB — I-STAT BETA HCG BLOOD, ED (MC, WL, AP ONLY): I-stat hCG, quantitative: <5 m[IU]/mL (ref <5)

## 2014-10-13 MED ORDER — TRAMADOL HCL 50 MG PO TABS: 50.0000 mg | ORAL_TABLET | Freq: Four times a day (QID) | ORAL | Status: DC | PRN

## 2014-10-13 MED ORDER — NAPROXEN 500 MG PO TABS: 500.0000 mg | ORAL_TABLET | Freq: Two times a day (BID) | ORAL | Status: DC

## 2014-10-13 MED ORDER — METHOCARBAMOL 500 MG PO TABS: 500.0000 mg | ORAL_TABLET | Freq: Two times a day (BID) | ORAL | Status: DC
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
## 2015-02-25 ENCOUNTER — Emergency Department (HOSPITAL_COMMUNITY)
Admission: EM | Admit: 2015-02-25 | Discharge: 2015-02-25 | Disposition: A | Discharge status: Home / Self Care | Payer: Self-pay | Attending: Emergency Medicine | Admitting: Emergency Medicine

## 2015-02-25 ENCOUNTER — Encounter (HOSPITAL_COMMUNITY): Payer: Self-pay | Admitting: Emergency Medicine

## 2015-02-25 DIAGNOSIS — A599 Trichomoniasis, unspecified: Secondary | ICD-10-CM

## 2015-02-25 DIAGNOSIS — A5901 Trichomonal vulvovaginitis: Secondary | ICD-10-CM | POA: Insufficient documentation

## 2015-02-25 DIAGNOSIS — Z3202 Encounter for pregnancy test, result negative: Secondary | ICD-10-CM | POA: Insufficient documentation

## 2015-02-25 DIAGNOSIS — F17210 Nicotine dependence, cigarettes, uncomplicated: Secondary | ICD-10-CM | POA: Insufficient documentation

## 2015-02-25 LAB — URINALYSIS, ROUTINE W REFLEX MICROSCOPIC
Bilirubin Urine: NEGATIVE
GLUCOSE, UA: NEGATIVE mg/dL
Hgb urine dipstick: NEGATIVE
Ketones, ur: NEGATIVE mg/dL
Nitrite: NEGATIVE
PROTEIN: NEGATIVE mg/dL
Specific Gravity, Urine: 1.02 (ref 1.005–1.030)
pH: 6.5 (ref 5.0–8.0)

## 2015-02-25 LAB — WET PREP, GENITAL
Sperm: NONE SEEN
Yeast Wet Prep HPF POC: NONE SEEN

## 2015-02-25 LAB — URINE MICROSCOPIC-ADD ON: RBC / HPF: NONE SEEN RBC/hpf (ref 0–5)

## 2015-02-25 LAB — PREGNANCY, URINE: PREG TEST UR: NEGATIVE

## 2015-02-25 MED ORDER — METRONIDAZOLE 500 MG PO TABS: 500.0000 mg | ORAL_TABLET | Freq: Two times a day (BID) | ORAL | Status: DC

## 2015-02-25 NOTE — ED Notes (Signed)
Pt c/o dysuria/burning with urination/sharp pain with urination/lower abdominal/groin/pelvic pain. Also reports white discharge which is different in nature than usual discharge. Denies N/V/D/fever/chills.

## 2015-02-25 NOTE — ED Notes (Signed)
Patient ready for pelvic. Pelvic equipment is setup

## 2015-02-25 NOTE — Discharge Instructions (Signed)
Trichomoniasis °Trichomoniasis is an infection caused by an organism called Trichomonas. The infection can affect both women and men. In women, the outer female genitalia and the vagina are affected. In men, the penis is mainly affected, but the prostate and other reproductive organs can also be involved. Trichomoniasis is a sexually transmitted infection (STI) and is most often passed to another person through sexual contact.  °RISK FACTORS °· Having unprotected sexual intercourse. °· Having sexual intercourse with an infected partner. °SIGNS AND SYMPTOMS  °Symptoms of trichomoniasis in women include: °· Abnormal gray-green frothy vaginal discharge. °· Itching and irritation of the vagina. °· Itching and irritation of the area outside the vagina. °Symptoms of trichomoniasis in men include:  °· Penile discharge with or without pain. °· Pain during urination. This results from inflammation of the urethra. °DIAGNOSIS  °Trichomoniasis may be found during a Pap test or physical exam. Your health care provider may use one of the following methods to help diagnose this infection: °· Testing the pH of the vagina with a test tape. °· Using a vaginal swab test that checks for the Trichomonas organism. A test is available that provides results within a few minutes. °· Examining a urine sample. °· Testing vaginal secretions. °Your health care provider may test you for other STIs, including HIV. °TREATMENT  °· You may be given medicine to fight the infection. Women should inform their health care provider if they could be or are pregnant. Some medicines used to treat the infection should not be taken during pregnancy. °· Your health care provider may recommend over-the-counter medicines or creams to decrease itching or irritation. °· Your sexual partner will need to be treated if infected. °· Your health care provider may test you for infection again 3 months after treatment. °HOME CARE INSTRUCTIONS  °· Take medicines only as  directed by your health care provider. °· Take over-the-counter medicine for itching or irritation as directed by your health care provider. °· Do not have sexual intercourse while you have the infection. °· Women should not douche or wear tampons while they have the infection. °· Discuss your infection with your partner. Your partner may have gotten the infection from you, or you may have gotten it from your partner. °· Have your sex partner get examined and treated if necessary. °· Practice safe, informed, and protected sex. °· See your health care provider for other STI testing. °SEEK MEDICAL CARE IF:  °· You still have symptoms after you finish your medicine. °· You develop abdominal pain. °· You have pain when you urinate. °· You have bleeding after sexual intercourse. °· You develop a rash. °· Your medicine makes you sick or makes you throw up (vomit). °MAKE SURE YOU: °· Understand these instructions. °· Will watch your condition. °· Will get help right away if you are not doing well or get worse. °  °This information is not intended to replace advice given to you by your health care provider. Make sure you discuss any questions you have with your health care provider. °  °Document Released: 08/22/2000 Document Revised: 03/19/2014 Document Reviewed: 12/08/2012 °Elsevier Interactive Patient Education ©2016 [employer] Inc[employer] ° ° °Sexually Transmitted Disease °A sexually transmitted disease (STD) is a disease or infection often passed to another person during sex. However, STDs can be passed through nonsexual ways. An STD can be passed through: °· Spit (saliva). °· Semen. °· Blood. °· Mucus from the vagina. °· Pee (urine). °HOW CAN I LESSEN MY CHANCES OF GETTING AN   STD? °· Use: °¨ Latex condoms. °¨ Water-soluble lubricants with condoms. Do not use petroleum jelly or oils. °¨ Dental dams. These are small pieces of latex that are used as a barrier during oral sex. °· Avoid having more than one sex partner. °· Do not  have sex with someone who has other sex partners. °· Do not have sex with anyone you do not know or who is at high risk for an STD. °· Avoid risky sex that can break your skin. °· Do not have sex if you have open sores on your mouth or skin. °· Avoid drinking too much alcohol or taking illegal drugs. Alcohol and drugs can affect your good judgment. °· Avoid oral and anal sex acts. °· Get shots (vaccines) for HPV and hepatitis. °· If you are at risk of being infected with HIV, it is advised that you take a certain medicine daily to prevent HIV infection. This is called pre-exposure prophylaxis (PrEP). You may be at risk if: °¨ You are a man who has sex with other men (MSM). °¨ You are attracted to the opposite sex (heterosexual) and are having sex with more than one partner. °¨ You take drugs with a needle. °¨ You have sex with someone who has HIV. °· Talk with your doctor about if you are at high risk of being infected with HIV. If you begin to take PrEP, get tested for HIV first. Get tested every 3 months for as long as you are taking PrEP. °· Get tested for STDs every year if you are sexually active. If you are treated for an STD, get tested again 3 months after you are treated. °WHAT SHOULD I DO IF I THINK I HAVE AN STD? °· See your doctor. °· Tell your sex partner(s) that you have an STD. They should be tested and treated. °· Do not have sex until your doctor says it is okay. °WHEN SHOULD I GET HELP? °Get help right away if: °· You have bad belly (abdominal) pain. °· You are a man and have puffiness (swelling) or pain in your testicles. °· You are a woman and have puffiness in your vagina. °  °This information is not intended to replace advice given to you by your health care provider. Make sure you discuss any questions you have with your health care provider. °  °Document Released: 04/05/2004 Document Revised: 03/19/2014 Document Reviewed: 08/22/2012 °Elsevier Interactive Patient Education ©2016 Elsevier  Inc. ° °

## 2015-02-25 NOTE — ED Provider Notes (Signed)
CSN: [id number]     Arrival date & time 02/25/15  1747 History   First MD Initiated Contact with Patient 02/25/15 1821     Chief Complaint  Patient presents with  . Abdominal Pain  . Dysuria  . Vaginal Discharge     (Consider location/radiation/quality/duration/timing/severity/associated sxs/prior Treatment) HPI   Felicia Edwards is a 24 y.o. female who presents for evaluation of dysuria, for 2 days without hematuria. She has also noticed "a little vaginal discharge". Her last menstrual cycle was 2. She denies fever, chills, nausea, vomiting, weakness or dizziness. There are no other known modifying factors.   History reviewed. No pertinent past medical history. History reviewed. No pertinent past surgical history. History reviewed. No pertinent family history. Social History  Substance Use Topics  . Smoking status: Current Some Day Smoker    Types: Cigarettes  . Smokeless tobacco: None  . Alcohol Use: Yes     Comment: Twice every two weeks.    OB History    No data available     Review of Systems  All other systems reviewed and are negative.     Allergies  Review of patient's allergies indicates no known allergies.  Home Medications   Prior to Admission medications   Medication Sig Start Date End Date Taking? Authorizing Provider  methocarbamol (ROBAXIN) 500 MG tablet Take 1 tablet (500 mg total) by mouth 2 (two) times daily. Patient not taking: Reported on 02/25/2015 10/13/14   Santiago GladHeather Laisure, PA-C  metroNIDAZOLE (FLAGYL) 500 MG tablet Take 1 tablet (500 mg total) by mouth 2 (two) times daily. Patient not taking: Reported on 09/15/2014 08/26/14   Santiago GladHeather Laisure, PA-C  naproxen (NAPROSYN) 500 MG tablet Take 1 tablet (500 mg total) by mouth 2 (two) times daily. Patient not taking: Reported on 02/25/2015 10/13/14   Santiago GladHeather Laisure, PA-C  traMADol (ULTRAM) 50 MG tablet Take 1 tablet (50 mg total) by mouth every 6 (six) hours as needed. Patient not taking: Reported on  02/25/2015 10/13/14   Santiago GladHeather Laisure, PA-C   BP 144/75 mmHg  Pulse 72  Temp(Src) 97.9 F (36.6 C) (Oral)  Resp 18  SpO2 99%  LMP 02/14/2015 (Approximate) Physical Exam  Constitutional: She is oriented to person, place, and time. She appears well-developed and well-nourished. No distress.  HENT:  Head: Normocephalic and atraumatic.  Right Ear: External ear normal.  Left Ear: External ear normal.  Eyes: Conjunctivae and EOM are normal. Pupils are equal, round, and reactive to light.  Neck: Normal range of motion and phonation normal. Neck supple.  Cardiovascular: Normal rate, regular rhythm and normal heart sounds.   Pulmonary/Chest: Effort normal and breath sounds normal. No respiratory distress. She exhibits no bony tenderness.  Abdominal: Soft. She exhibits no distension. There is no tenderness. There is no guarding.  Genitourinary:  Normal external female genitalia. Small amount of thin white opaque vaginal discharge. No cervicitis. No cervical motion tenderness. No uterine or adnexal enlargement, mass or tenderness  Musculoskeletal: Normal range of motion.  Neurological: She is alert and oriented to person, place, and time. No cranial nerve deficit or sensory deficit. She exhibits normal muscle tone. Coordination normal.  Skin: Skin is warm, dry and intact.  Psychiatric: She has a normal mood and affect. Her behavior is normal. Judgment and thought content normal.  Nursing note and vitals reviewed.   ED Course  Procedures (including critical care time)  Medications - No data to display  Patient Vitals for the past 24 hrs:  BP Temp Temp  src Pulse Resp SpO2  02/25/15 2218 125/69 mmHg - - 70 18 100 %  02/25/15 2011 144/75 mmHg - - 72 18 99 %  02/25/15 1754 146/80 mmHg 97.9 F (36.6 C) Oral 90 18 99 %        Labs Review Labs Reviewed  URINALYSIS, ROUTINE W REFLEX MICROSCOPIC (NOT AT [facility]) - Abnormal; Notable for the following:    APPearance CLOUDY (*)    Leukocytes, UA  TRACE (*)    All other components within normal limits  URINE MICROSCOPIC-ADD ON - Abnormal; Notable for the following:    Squamous Epithelial / LPF 0-5 (*)    Bacteria, UA FEW (*)    All other components within normal limits  URINE CULTURE  WET PREP, GENITAL  PREGNANCY, URINE  RPR  HIV ANTIBODY (ROUTINE TESTING)  GC/CHLAMYDIA PROBE AMP ([place]) NOT AT [facility]    Imaging Review No results found. I have personally reviewed and evaluated these images and lab results as part of my medical decision-making.   EKG Interpretation None      MDM   Final diagnoses:  Trichomoniasis    Dysuria and vaginal discharge, secondary to trichomonas infection. Doubt PID, or serious bacterial infection.   Nursing Notes Reviewed/ Care Coordinated Applicable Imaging Reviewed Interpretation of Laboratory Data incorporated into ED treatment  The patient appears reasonably screened and/or stabilized for discharge and I doubt any other medical condition or other [facility] requiring further screening, evaluation, or treatment in the ED at this time prior to discharge.  Plan: Home Medications- Flagyl; Home Treatments- rest; return here if the recommended treatment, does not improve the symptoms; Recommended follow up- PCP prn     Mancel Bale, MD 02/25/15 2349

## 2015-02-26 LAB — HIV ANTIBODY (ROUTINE TESTING W REFLEX): HIV SCREEN 4TH GENERATION: NONREACTIVE

## 2015-02-26 LAB — RPR: RPR Ser Ql: NONREACTIVE

## 2015-02-27 LAB — URINE CULTURE: Special Requests: NORMAL

## 2015-02-28 LAB — GC/CHLAMYDIA PROBE AMP (~~LOC~~) NOT AT ARMC
Chlamydia: NEGATIVE
Neisseria Gonorrhea: NEGATIVE

## 2015-03-10 ENCOUNTER — Telehealth (HOSPITAL_COMMUNITY): Payer: Self-pay

## 2015-06-09 ENCOUNTER — Encounter (HOSPITAL_COMMUNITY): Payer: Self-pay

## 2015-06-09 ENCOUNTER — Emergency Department (HOSPITAL_COMMUNITY)
Admission: EM | Admit: 2015-06-09 | Discharge: 2015-06-10 | Disposition: A | Discharge status: Home / Self Care | Payer: No Typology Code available for payment source | Attending: Emergency Medicine | Admitting: Emergency Medicine

## 2015-06-09 DIAGNOSIS — N898 Other specified noninflammatory disorders of vagina: Secondary | ICD-10-CM | POA: Insufficient documentation

## 2015-06-09 DIAGNOSIS — F17210 Nicotine dependence, cigarettes, uncomplicated: Secondary | ICD-10-CM | POA: Insufficient documentation

## 2015-06-09 NOTE — ED Notes (Signed)
Pt called  No response from lobby  
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
## 2015-06-10 NOTE — ED Notes (Signed)
Pt called x 2 with no reply

## 2015-06-20 ENCOUNTER — Emergency Department (HOSPITAL_COMMUNITY)
Admission: EM | Admit: 2015-06-20 | Discharge: 2015-06-20 | Discharge status: Absent without leave | Payer: No Typology Code available for payment source

## 2015-07-03 ENCOUNTER — Emergency Department (HOSPITAL_COMMUNITY): Payer: Self-pay

## 2015-07-03 ENCOUNTER — Encounter (HOSPITAL_COMMUNITY): Payer: Self-pay | Admitting: Emergency Medicine

## 2015-07-03 ENCOUNTER — Emergency Department (HOSPITAL_COMMUNITY)
Admission: EM | Admit: 2015-07-03 | Discharge: 2015-07-03 | Disposition: A | Discharge status: Home / Self Care | Payer: Self-pay | Attending: Emergency Medicine | Admitting: Emergency Medicine

## 2015-07-03 DIAGNOSIS — N760 Acute vaginitis: Secondary | ICD-10-CM | POA: Insufficient documentation

## 2015-07-03 DIAGNOSIS — B9689 Other specified bacterial agents as the cause of diseases classified elsewhere: Secondary | ICD-10-CM

## 2015-07-03 DIAGNOSIS — R102 Pelvic and perineal pain: Secondary | ICD-10-CM

## 2015-07-03 DIAGNOSIS — Z3202 Encounter for pregnancy test, result negative: Secondary | ICD-10-CM | POA: Insufficient documentation

## 2015-07-03 DIAGNOSIS — F17210 Nicotine dependence, cigarettes, uncomplicated: Secondary | ICD-10-CM | POA: Insufficient documentation

## 2015-07-03 LAB — URINALYSIS, ROUTINE W REFLEX MICROSCOPIC
Bilirubin Urine: NEGATIVE
Glucose, UA: NEGATIVE mg/dL
Hgb urine dipstick: NEGATIVE
KETONES UR: NEGATIVE mg/dL
LEUKOCYTES UA: NEGATIVE
Nitrite: NEGATIVE
PH: 6 (ref 5.0–8.0)
Protein, ur: NEGATIVE mg/dL
Specific Gravity, Urine: 1.031 — ABNORMAL HIGH (ref 1.005–1.030)

## 2015-07-03 LAB — WET PREP, GENITAL
Sperm: NONE SEEN
TRICH WET PREP: NONE SEEN
YEAST WET PREP: NONE SEEN

## 2015-07-03 LAB — CBC WITH DIFFERENTIAL/PLATELET
BASOS ABS: 0 10*3/uL (ref 0.0–0.1)
Basophils Relative: 1 %
EOS PCT: 2 %
Eosinophils Absolute: 0.1 10*3/uL (ref 0.0–0.7)
HCT: 37.8 % (ref 36.0–46.0)
HEMOGLOBIN: 13.5 g/dL (ref 12.0–15.0)
LYMPHS PCT: 36 %
Lymphs Abs: 1.3 10*3/uL (ref 0.7–4.0)
MCH: 31.5 pg (ref 26.0–34.0)
MCHC: 35.7 g/dL (ref 30.0–36.0)
MCV: 88.3 fL (ref 78.0–100.0)
Monocytes Absolute: 0.4 10*3/uL (ref 0.1–1.0)
Monocytes Relative: 9 %
NEUTROS PCT: 52 %
Neutro Abs: 2 10*3/uL (ref 1.7–7.7)
PLATELETS: 235 10*3/uL (ref 150–400)
RBC: 4.28 MIL/uL (ref 3.87–5.11)
RDW: 12.4 % (ref 11.5–15.5)
WBC: 3.7 10*3/uL — AB (ref 4.0–10.5)

## 2015-07-03 LAB — PREGNANCY, URINE: Preg Test, Ur: NEGATIVE

## 2015-07-03 LAB — RAPID HIV SCREEN (HIV 1/2 AB+AG)
HIV 1/2 Antibodies: NONREACTIVE
HIV-1 P24 Antigen - HIV24: NONREACTIVE
Interpretation (HIV Ag Ab): NONREACTIVE

## 2015-07-03 MED ORDER — IBUPROFEN 200 MG PO TABS
600.0000 mg | ORAL_TABLET | Freq: Once | ORAL | Status: AC
  Administered 2015-07-03: 600 mg via ORAL
  Filled 2015-07-03: qty 3

## 2015-07-03 MED ORDER — METRONIDAZOLE 500 MG PO TABS: 500.0000 mg | ORAL_TABLET | Freq: Two times a day (BID) | ORAL | Status: DC

## 2015-07-03 NOTE — ED Provider Notes (Signed)
CSN: [id number]     Arrival date & time 07/03/15  1032 History   First MD Initiated Contact with Patient 07/03/15 1048     Chief Complaint  Patient presents with  . Abdominal Pain  . Vaginal Discharge     (Consider location/radiation/quality/duration/timing/severity/associated sxs/prior Treatment) HPI 25 year old female who presents with vaginal discharge and pelvic pain. Otherwise healthy. States 2 week of thick, foul smelling vaginal discharge. Associated with low pelvic pain. LMP 2 weeks ago. Has had unprotected sexual intercourse before this, but does not expect STD from her partner. Denies fever, chills, N/v/D, flank pain, dysuria, urinary frequency.   History reviewed. No pertinent past medical history. History reviewed. No pertinent past surgical history. History reviewed. No pertinent family history. Social History  Substance Use Topics  . Smoking status: Current Some Day Smoker    Types: Cigarettes  . Smokeless tobacco: Never Used  . Alcohol Use: Yes     Comment: occasionally   OB History    No data available     Review of Systems 10/14 systems reviewed and are negative other than those stated in the HPI    Allergies  Review of patient's allergies indicates no known allergies.  Home Medications   Prior to Admission medications   Medication Sig Start Date End Date Taking? Authorizing Provider  methocarbamol (ROBAXIN) 500 MG tablet Take 1 tablet (500 mg total) by mouth 2 (two) times daily. Patient not taking: Reported on 02/25/2015 10/13/14   Santiago Glad, PA-C  metroNIDAZOLE (FLAGYL) 500 MG tablet Take 1 tablet (500 mg total) by mouth 2 (two) times daily. One po bid x 7 days Patient not taking: Reported on 07/03/2015 02/25/15   Mancel Bale, MD  metroNIDAZOLE (FLAGYL) 500 MG tablet Take 1 tablet (500 mg total) by mouth 2 (two) times daily. 07/03/15   Lavera Guise, MD  naproxen (NAPROSYN) 500 MG tablet Take 1 tablet (500 mg total) by mouth 2 (two) times  daily. Patient not taking: Reported on 02/25/2015 10/13/14   Santiago Glad, PA-C  traMADol (ULTRAM) 50 MG tablet Take 1 tablet (50 mg total) by mouth every 6 (six) hours as needed. Patient not taking: Reported on 02/25/2015 10/13/14   Santiago Glad, PA-C   BP 115/78 mmHg  Pulse 66  Temp(Src) 98.2 F (36.8 C) (Oral)  Resp 14  Ht  (1.6 m)  Wt 160 lb (72.576 kg)  BMI 28.35 kg/m2  SpO2 99%  LMP 06/19/2015 Physical Exam Physical Exam  Nursing note and vitals reviewed. Constitutional: Well developed, well nourished, non-toxic, and in no acute distress Head: Normocephalic and atraumatic.  Mouth/Throat: Oropharynx is clear and moist.  Neck: Normal range of motion. Neck supple.  Cardiovascular: Normal rate and regular rhythm.   Pulmonary/Chest: Effort normal and breath sounds normal.  Abdominal: Soft. There is no tenderness. There is no rebound and no guarding.  Pelvic: Normal external genitalia. Normal internal genitalia. Some white vaginal discharge. No blood within the vagina. No cervical motion tenderness. No adnexal masses. Left adnexal tenderness.. Musculoskeletal: Normal range of motion.  Neurological: Alert, no facial droop, fluent speech, moves all extremities symmetrically Skin: Skin is warm and dry.  Psychiatric: Cooperative  ED Course  Procedures (including critical care time) Labs Review Labs Reviewed  WET PREP, GENITAL - Abnormal; Notable for the following:    Clue Cells Wet Prep HPF POC PRESENT (*)    WBC, Wet Prep HPF POC FEW (*)    All other components within normal limits  URINALYSIS,  ROUTINE W REFLEX MICROSCOPIC (NOT AT [facility]) - Abnormal; Notable for the following:    Specific Gravity, Urine 1.031 (*)    All other components within normal limits  CBC WITH DIFFERENTIAL/PLATELET - Abnormal; Notable for the following:    WBC 3.7 (*)    All other components within normal limits  PREGNANCY, URINE  RAPID HIV SCREEN (HIV 1/2 AB+AG)  RPR  GC/CHLAMYDIA PROBE AMP  ([place]) NOT AT [facility]    Imaging Review Koreas Transvaginal Non-ob  07/03/2015  CLINICAL DATA:  Left pelvic pain x2 weeks, left adnexal tenderness EXAM: TRANSABDOMINAL AND TRANSVAGINAL ULTRASOUND OF PELVIS DOPPLER ULTRASOUND OF OVARIES TECHNIQUE: Both transabdominal and transvaginal ultrasound examinations of the pelvis were performed. Transabdominal technique was performed for global imaging of the pelvis including uterus, ovaries, adnexal regions, and pelvic cul-de-sac. It was necessary to proceed with endovaginal exam following the transabdominal exam to visualize the bilateral ovaries. Color and duplex Doppler ultrasound was utilized to evaluate blood flow to the ovaries. COMPARISON:  None. FINDINGS: Uterus Measurements: 8.1 x 4.2 x 4.7 cm. No fibroids or other mass visualized. Endometrium Thickness: 10 mm.  No focal abnormality visualized. Right ovary Measurements: 4.0 x 1.9 x 2.7 cm. Normal appearance/no adnexal mass. Left ovary Measurements: 3.3 x 2.3 x 2.9 cm. Normal appearance/no adnexal mass. Pulsed Doppler evaluation of both ovaries demonstrates normal low-resistance arterial and venous waveforms. Other findings No abnormal free fluid. IMPRESSION: Negative pelvic ultrasound. No evidence of ovarian torsion. Electronically Signed   By: Charline BillsSriyesh  Krishnan M.D.   On: 07/03/2015 12:43   [place] Pelvis Complete  07/03/2015  CLINICAL DATA:  Left pelvic pain x2 weeks, left adnexal tenderness EXAM: TRANSABDOMINAL AND TRANSVAGINAL ULTRASOUND OF PELVIS DOPPLER ULTRASOUND OF OVARIES TECHNIQUE: Both transabdominal and transvaginal ultrasound examinations of the pelvis were performed. Transabdominal technique was performed for global imaging of the pelvis including uterus, ovaries, adnexal regions, and pelvic cul-de-sac. It was necessary to proceed with endovaginal exam following the transabdominal exam to visualize the bilateral ovaries. Color and duplex Doppler ultrasound was utilized to evaluate blood flow to the  ovaries. COMPARISON:  None. FINDINGS: Uterus Measurements: 8.1 x 4.2 x 4.7 cm. No fibroids or other mass visualized. Endometrium Thickness: 10 mm.  No focal abnormality visualized. Right ovary Measurements: 4.0 x 1.9 x 2.7 cm. Normal appearance/no adnexal mass. Left ovary Measurements: 3.3 x 2.3 x 2.9 cm. Normal appearance/no adnexal mass. Pulsed Doppler evaluation of both ovaries demonstrates normal low-resistance arterial and venous waveforms. Other findings No abnormal free fluid. IMPRESSION: Negative pelvic ultrasound. No evidence of ovarian torsion. Electronically Signed   By: Charline BillsSriyesh  Krishnan M.D.   On: 07/03/2015 12:43   [place] Art/ven Flow Abd Pelv Doppler  07/03/2015  CLINICAL DATA:  Left pelvic pain x2 weeks, left adnexal tenderness EXAM: TRANSABDOMINAL AND TRANSVAGINAL ULTRASOUND OF PELVIS DOPPLER ULTRASOUND OF OVARIES TECHNIQUE: Both transabdominal and transvaginal ultrasound examinations of the pelvis were performed. Transabdominal technique was performed for global imaging of the pelvis including uterus, ovaries, adnexal regions, and pelvic cul-de-sac. It was necessary to proceed with endovaginal exam following the transabdominal exam to visualize the bilateral ovaries. Color and duplex Doppler ultrasound was utilized to evaluate blood flow to the ovaries. COMPARISON:  None. FINDINGS: Uterus Measurements: 8.1 x 4.2 x 4.7 cm. No fibroids or other mass visualized. Endometrium Thickness: 10 mm.  No focal abnormality visualized. Right ovary Measurements: 4.0 x 1.9 x 2.7 cm. Normal appearance/no adnexal mass. Left ovary Measurements: 3.3 x 2.3 x 2.9 cm. Normal appearance/no adnexal mass.  Pulsed Doppler evaluation of both ovaries demonstrates normal low-resistance arterial and venous waveforms. Other findings No abnormal free fluid. IMPRESSION: Negative pelvic ultrasound. No evidence of ovarian torsion. Electronically Signed   By: Charline Bills M.D.   On: 07/03/2015 12:43   I have personally reviewed  and evaluated these images and lab results as part of my medical decision-making.   EKG Interpretation None      MDM   Final diagnoses:  Pelvic pain in female  Bacterial vaginosis    25 year old female who presents with pelvic pain and vaginal discharge. Presentation with normal vital signs. She is well-appearing and in no acute distress. Her abdomen is benign and nontender, and he seems to be localized to her left neck supple. Pelvic exam with some vaginal discharge, but no evidence of cervical motion tenderness or masses. Pelvic ultrasound was performed showing no acute pelvic processes. What prep performed and she is positive for BV. We'll treat with a course of Flagyl. She is not concerned for STDs at this time, but testing was sent. We will only treat of testing comes back positive. UA is unremarkable and she is not pregnant. Remainder of her blood work is unremarkable. On reexam her abdomen remains nontender and pain improved after Motrin. This time I do not suspect any serious or toxic intra-abdominal or pelvic process. Discussed supportive care instructions for home. Strict return follow-up instructions are reviewed. She expressed understanding of all discharge instructions, and felt comfortable to plan of care.    Lavera Guise, MD 07/03/15 [phone]

## 2015-07-03 NOTE — ED Notes (Signed)
Patient transported to Ultrasound 

## 2015-07-03 NOTE — Discharge Instructions (Signed)
We did not find any serious cause of your symptoms today. You are given some antibiotics for your infection. Continue to take Tylenol and Motrin as needed for pain control. Return without fail for worsening symptoms including fever, vomiting unable to keep down food or fluids, worsening pain, or any other symptoms concerning to you. We will call you for any abnormal STD testing that ws performed today.  Abdominal Pain, Adult Many things can cause belly (abdominal) pain. Most times, the belly pain is not dangerous. Many cases of belly pain can be watched and treated at home. HOME CARE   Do not take medicines that help you go poop (laxatives) unless told to by your doctor.  Only take medicine as told by your doctor.  Eat or drink as told by your doctor. Your doctor will tell you if you should be on a special diet. GET HELP IF:  You do not know what is causing your belly pain.  You have belly pain while you are sick to your stomach (nauseous) or have runny poop (diarrhea).  You have pain while you pee or poop.  Your belly pain wakes you up at night.  You have belly pain that gets worse or better when you eat.  You have belly pain that gets worse when you eat fatty foods.  You have a fever. GET HELP RIGHT AWAY IF:   The pain does not go away within 2 hours.  You keep throwing up (vomiting).  The pain changes and is only in the right or left part of the belly.  You have bloody or tarry looking poop. MAKE SURE YOU:   Understand these instructions.  Will watch your condition.  Will get help right away if you are not doing well or get worse.   This information is not intended to replace advice given to you by your health care provider. Make sure you discuss any questions you have with your health care provider.   Document Released: 08/15/2007 Document Revised: 03/19/2014 Document Reviewed: 11/05/2012 Elsevier Interactive Patient Education 2016 Elsevier Inc.  Bacterial  Vaginosis Bacterial vaginosis is a vaginal infection that occurs when the normal balance of bacteria in the vagina is disrupted. It results from an overgrowth of certain bacteria. This is the most common vaginal infection in women of childbearing age. Treatment is important to prevent complications, especially in pregnant women, as it can cause a premature delivery. CAUSES  Bacterial vaginosis is caused by an increase in harmful bacteria that are normally present in smaller amounts in the vagina. Several different kinds of bacteria can cause bacterial vaginosis. However, the reason that the condition develops is not fully understood. RISK FACTORS Certain activities or behaviors can put you at an increased risk of developing bacterial vaginosis, including:  Having a new sex partner or multiple sex partners.  Douching.  Using an intrauterine device (IUD) for contraception. Women do not get bacterial vaginosis from toilet seats, bedding, swimming pools, or contact with objects around them. SIGNS AND SYMPTOMS  Some women with bacterial vaginosis have no signs or symptoms. Common symptoms include:  Grey vaginal discharge.  A fishlike odor with discharge, especially after sexual intercourse.  Itching or burning of the vagina and vulva.  Burning or pain with urination. DIAGNOSIS  Your health care provider will take a medical history and examine the vagina for signs of bacterial vaginosis. A sample of vaginal fluid may be taken. Your health care provider will look at this sample under a microscope to check  for bacteria and abnormal cells. A vaginal pH test may also be done.  TREATMENT  Bacterial vaginosis may be treated with antibiotic medicines. These may be given in the form of a pill or a vaginal cream. A second round of antibiotics may be prescribed if the condition comes back after treatment. Because bacterial vaginosis increases your risk for sexually transmitted diseases, getting treated can  help reduce your risk for chlamydia, gonorrhea, HIV, and herpes. HOME CARE INSTRUCTIONS   Only take over-the-counter or prescription medicines as directed by your health care provider.  If antibiotic medicine was prescribed, take it as directed. Make sure you finish it even if you start to feel better.  Tell all sexual partners that you have a vaginal infection. They should see their health care provider and be treated if they have problems, such as a mild rash or itching.  During treatment, it is important that you follow these instructions:  Avoid sexual activity or use condoms correctly.  Do not douche.  Avoid alcohol as directed by your health care provider.  Avoid breastfeeding as directed by your health care provider. SEEK MEDICAL CARE IF:   Your symptoms are not improving after 3 days of treatment.  You have increased discharge or pain.  You have a fever. MAKE SURE YOU:   Understand these instructions.  Will watch your condition.  Will get help right away if you are not doing well or get worse. FOR MORE INFORMATION  Centers for Disease Control and Prevention, Division of STD Prevention: SolutionApps.co.zawww.cdc.gov/std American Sexual Health Association (ASHA): www.ashastd.org    This information is not intended to replace advice given to you by your health care provider. Make sure you discuss any questions you have with your health care provider.   Document Released: 02/26/2005 Document Revised: 03/19/2014 Document Reviewed: 10/08/2012 Elsevier Interactive Patient Education [employer]2016 Elsevier Inc.

## 2015-07-03 NOTE — ED Notes (Signed)
Pt returned to room from US.

## 2015-07-03 NOTE — ED Notes (Signed)
Pt reported having lower abd pain and vaginal discharge. Pt denies fever and urinary symptoms. Reported vaginal discharge of yellowish thick foul odor without itching/irritation.

## 2015-07-04 LAB — RPR: RPR Ser Ql: NONREACTIVE

## 2015-07-04 LAB — GC/CHLAMYDIA PROBE AMP (~~LOC~~) NOT AT ARMC
CHLAMYDIA, DNA PROBE: NEGATIVE
Neisseria Gonorrhea: NEGATIVE

## 2015-07-18 ENCOUNTER — Encounter (HOSPITAL_COMMUNITY): Payer: Self-pay | Admitting: Emergency Medicine

## 2015-07-18 ENCOUNTER — Emergency Department (HOSPITAL_COMMUNITY)
Admission: EM | Admit: 2015-07-18 | Discharge: 2015-07-18 | Disposition: A | Discharge status: Home / Self Care | Payer: No Typology Code available for payment source | Attending: Dermatology | Admitting: Dermatology

## 2015-07-18 DIAGNOSIS — Z5321 Procedure and treatment not carried out due to patient leaving prior to being seen by health care provider: Secondary | ICD-10-CM | POA: Insufficient documentation

## 2015-07-18 DIAGNOSIS — F17210 Nicotine dependence, cigarettes, uncomplicated: Secondary | ICD-10-CM | POA: Insufficient documentation

## 2015-07-18 DIAGNOSIS — N898 Other specified noninflammatory disorders of vagina: Secondary | ICD-10-CM | POA: Insufficient documentation

## 2015-07-18 DIAGNOSIS — Z792 Long term (current) use of antibiotics: Secondary | ICD-10-CM | POA: Insufficient documentation

## 2015-07-18 NOTE — ED Notes (Signed)
Patient here with complaint of milky, white vaginal discharge and irration. Reports trying Monistat with no relief. Denies urinary symptoms.

## 2015-07-18 NOTE — ED Notes (Signed)
MADE THREE CALLS IN LOBBY ,BATHROOMS AND PARKING AREA WITH NO ANSWER

## 2015-07-18 NOTE — ED Notes (Signed)
Per Molly Maduroobert EMT, Pt has been called 3xs w/o answer.  It is assumed the Pt left.

## 2015-07-20 ENCOUNTER — Encounter (HOSPITAL_COMMUNITY): Payer: Self-pay

## 2015-07-20 ENCOUNTER — Emergency Department (HOSPITAL_COMMUNITY)
Admission: EM | Admit: 2015-07-20 | Discharge: 2015-07-20 | Disposition: A | Discharge status: Home / Self Care | Payer: No Typology Code available for payment source | Attending: Emergency Medicine | Admitting: Emergency Medicine

## 2015-07-20 DIAGNOSIS — N898 Other specified noninflammatory disorders of vagina: Secondary | ICD-10-CM

## 2015-07-20 DIAGNOSIS — Z792 Long term (current) use of antibiotics: Secondary | ICD-10-CM | POA: Insufficient documentation

## 2015-07-20 DIAGNOSIS — F17210 Nicotine dependence, cigarettes, uncomplicated: Secondary | ICD-10-CM | POA: Insufficient documentation

## 2015-07-20 LAB — RAPID HIV SCREEN (HIV 1/2 AB+AG)
HIV 1/2 ANTIBODIES: NONREACTIVE
HIV-1 P24 Antigen - HIV24: NONREACTIVE

## 2015-07-20 LAB — WET PREP, GENITAL
CLUE CELLS WET PREP: NONE SEEN
Sperm: NONE SEEN
Trich, Wet Prep: NONE SEEN
Yeast Wet Prep HPF POC: NONE SEEN

## 2015-07-20 LAB — I-STAT BETA HCG BLOOD, ED (MC, WL, AP ONLY)

## 2015-07-20 MED ORDER — REPHRESH VA GEL: 1.0000 "application " | Freq: Every evening | VAGINAL | Status: DC | PRN

## 2015-07-20 NOTE — Discharge Instructions (Signed)
Read the information below.  Use the prescribed medication as directed.  Please discuss all new medications with your pharmacist.  You may return to the Emergency Department at any time for worsening condition or any new symptoms that concern you. Avoid scented soaps or baths. Wash with plain white dove soap. Do not douche. Be sure to establish a primary care provider, you are provided resources below for further follow up. Return to ED if you develop fevers, chest pain, shortness of breath, loss of consciousness, focal abdominal pain, vomiting blood, or pelvic pain with discharge or bleeding.    AllstateCommunity Resource Guide Financial Assistance The United Ways 211 is a great source of information about community services available.  Access by dialing 2-1-1 from anywhere in [place], or by website -  PooledIncome.plwww.nc211.org.   Other Local Resources (Updated 03/2015)  Financial Assistance   Services    Phone Number and Address  [facility]  Low-cost medical care - 1st and 3rd Saturday of every month  Must not qualify for public or private insurance and must have limited income [phone] [address] [place], [place]    [place] [facility] of Social Services  Child care  Emergency assistance for housing and Kimberly-Clarkutilities  Food stamps  Medicaid [phone] [address] [place], [place] [address]   [facility]  Low-cost medical care for children, communicable diseases, sexually-transmitted diseases, immunizations, maternity care, womens health and family planning [phone] [place], [place] [address]  [facility]   Medication assistance for [facility] [place] residents  Must meet income requirements [phone] [address] [place], [place].    [facility]  Child care  Emergency assistance for housing and SYSCOutilities  Food  stamps  Medicaid [phone] [address] [place], [place] [address]  [facility]   Low-cost medical care,   Monday through Friday, 9 am to 6 pm.   Accepts Medicare/Medicaid, and self-pay [phone] [address] [place], [place] [address]  [facility]  Low-cost medical care - Monday through Friday, 8:30 am - 5:30 pm  Accepts Medicaid and self-pay [phone] [address] [place], [place] [address]   [facility]  Primary medical care, including for those with sickle cell disease  Accepts Medicare, Medicaid, insurance and self-pay [phone] [address] [place], [place]  [facility]   Primary medical care  Accepts Medicare, [place], insurance and self-pay [phone] [place], [place] [address]   [facility] Department of Social Services  Child care  Emergency assistance for housing and Kimberly-Clarkutilities  Food stamps  Medicaid [phone] [address] [place], [place] [address]  [facility] Department of Health and [employer]Human Services  Child care  Emergency assistance for housing and Kimberly-Clarkutilities  Food stamps  Medicaid [phone] [address] [place], [place] [address]   [facility] Medication Assistance [facility]  Medication assistance for [facility] residents with no insurance only  Must have a primary care doctor [phone] [address] [place], [place]  [facility]   Primary medical care  [place] Medicare, [place], insurance  [phone] [address] [place], [place]  MedAssist   Medication assistance [phone]  [facility]   Primary medical care  Accepts Medicare, [place], insurance and self-pay [phone] [address] [place], [place] [address]  [facility]   Primary medical care  Accepts Medicare,  Medicaid, insurance and self-pay [phone] [address] [place], [place] [address]  [facility]  For [place] residents between the ages of 4 and 64 who do not have any form of health insurance, Medicare, [place], or [place] benefits.  Services are provided free of charge to uninsured patients who fall within federal poverty guidelines.    Hours: Tuesdays and Thursdays, 4:15 - 8 pm [phone] [address] [place], [place] [address]  [facility]     Primary medical care  Dental care  Nutritional counseling  Pharmacy  Accepts Medicaid, Medicare, most insurance.  Fees are adjusted based on ability to pay.   [phone] [facility] [address] [place], [place]  [phone] [facility] [address] [place], [place]  [phone] [facility] [place], [place]  [phone] [facility], [address] [place], [place]  [phone] [facility] [address] [place], [place]  [facility]  Womens health and family planning [phone] [address] [place], [place]  [facility] Department of Social Services  Child care  Emergency assistance for housing and [employer]ark  Medicaid [phone], [place], [place] [address]   [facility]    Ages 74 and older  Hours: Mondays and Thursdays, 7:00 am - 9:00 am Patients are seen on a first come, first served basis. [phone], [phone] [place], [place]  [facility] Division of Social Services  Child care  Emergency assistance for housing and [employer]  Medicaid [phone] [address] [place], [place] [address]  [facility]  Medication assistance  Rental assistance  Food pantry  Medication assistance  Housing assistance  Emergency food  distribution  Utility assistance [phone] [address] [place], [place]  [phone]  [address] [place], [place] [address] Hours: Tuesdays and Thursdays from 9am - 12 noon by appointment only  [phone] [address] [place], [place] [address]  [facility] - Lanae Boast   Accepts private insurance, [place], and [place].  Payment is based on a sliding scale for those without insurance.  Hours: Mondays, Tuesdays and Thursdays, 8:30 am - 5:30 pm.   [phone] [address] [place], [place]  [facility] - Family Medicine at [facility], [place], and [place].  Payment is based on a sliding scale for those without insurance. [phone] [address] [place], [place]  [facility] - Pediatrics at E. Scientist, research (physical sciences), [employer], and [place].  Payment is based on a sliding scale for those without insurance [phone] [address], [employer], [place]  [facility] - Pediatrics at [employer], [place], and [place].  Payment is based on a sliding scale for those without insurance. [phone] [address] [place], [place]  [facility] - Pediatrics at [facility], [place], and [place].  Payment is based on a sliding scale for those without insurance. [phone], [phone] [place], [place].    [facility]  Maternity care.  Accepts Medicaid and self-pay. [phone] [address] [place], [place]

## 2015-07-20 NOTE — ED Notes (Signed)
Verbalized understanding discharge instructions. In no acute distress.   

## 2015-07-20 NOTE — ED Provider Notes (Signed)
CSN: [id number]     Arrival date & time 07/20/15  0802 History   First MD Initiated Contact with Patient 07/20/15 [phone]     Chief Complaint  Patient presents with  . Vaginal Irritation      (Consider location/radiation/quality/duration/timing/severity/associated sxs/prior Treatment) HPI Comments: Felicia Edwards is a 25 y.o. female reports to ED with complaint of vaginal irritation. Symptoms started on Thursday. Tried a dose of monistat on Sunday with minimal relief. Endorses recent use of scented soap and "scrubbing too hard." Patient was recently seen in the ED (07/03/15) with similar symptoms and found to have bacterial vaginosis. She was treated with antibiotics, endorses taking the full course of ABX and was symptom free until Thursday. She denies any vaginal discharge or vaginal bleeding. No pelvic pain. Endorses some nausea, but otherwise no abdominal complaints. Denies dysuria or hematuria. No fever, chills, or night sweats. LMP was beginning of April, endorses that menstrual cycles are irregular. She is sexually active with one female partner, she does not use protection.   The history is provided by the patient and medical records.    History reviewed. No pertinent past medical history. History reviewed. No pertinent past surgical history. History reviewed. No pertinent family history. Social History  Substance Use Topics  . Smoking status: Current Some Day Smoker    Types: Cigarettes  . Smokeless tobacco: Never Used  . Alcohol Use: Yes     Comment: occasionally   OB History    No data available     Review of Systems  Gastrointestinal: Positive for nausea.  Genitourinary: Positive for vaginal pain.  All other systems reviewed and are negative.     Allergies  Review of patient's allergies indicates no known allergies.  Home Medications   Prior to Admission medications   Medication Sig Start Date End Date Taking? Authorizing Provider  metroNIDAZOLE (FLAGYL) 500 MG  tablet Take 1 tablet (500 mg total) by mouth 2 (two) times daily. One po bid x 7 days Patient not taking: Reported on 07/03/2015 02/25/15   Mancel Bale, MD  metroNIDAZOLE (FLAGYL) 500 MG tablet Take 1 tablet (500 mg total) by mouth 2 (two) times daily. Patient not taking: Reported on 07/20/2015 07/03/15   Lavera Guise, MD  Vaginal Lubricant [facility]) GEL Place 1 application vaginally at bedtime as needed (vaginal irritation). 07/20/15   Lona Kettle, PA-C   BP 121/78 mmHg  Pulse 72  Temp(Src) 98 F (36.7 C) (Oral)  Resp 14  SpO2 100%  LMP 06/19/2015 (Approximate) Physical Exam  Constitutional: She appears well-developed and well-nourished. No distress.  HENT:  Head: Normocephalic and atraumatic.  Mouth/Throat: Oropharynx is clear and moist. No oropharyngeal exudate.  Eyes: Conjunctivae and EOM are normal. Pupils are equal, round, and reactive to light. Right eye exhibits no discharge. Left eye exhibits no discharge. No scleral icterus.  Neck: Normal range of motion. Neck supple.  Cardiovascular: Normal rate, regular rhythm, normal heart sounds and intact distal pulses.   No murmur heard. Pulmonary/Chest: Effort normal and breath sounds normal. No respiratory distress.  Abdominal: Soft. Bowel sounds are normal. There is no tenderness. There is no rebound and no guarding.  Genitourinary: Pelvic exam was performed with patient prone. Cervix exhibits discharge. Cervix exhibits no motion tenderness and no friability. Right adnexum displays no mass and no tenderness. Left adnexum displays no mass and no tenderness.  Chaperone present for duration of exam. No lacerations, ulcerations or masses noted in vaginal cavity. Cervix is closed, non-friable, with mild  gray discharge noted. No CMT.   Musculoskeletal: Normal range of motion.  Lymphadenopathy:    She has no cervical adenopathy.  Neurological: She is alert. Coordination normal.  Skin: Skin is warm and dry. She is not diaphoretic.   Psychiatric: She has a normal mood and affect.    ED Course  Procedures (including critical care time) Labs Review Labs Reviewed  WET PREP, GENITAL - Abnormal; Notable for the following:    WBC, Wet Prep HPF POC FEW (*)    All other components within normal limits  RAPID HIV SCREEN (HIV 1/2 AB+AG)  RPR  I-STAT BETA HCG BLOOD, ED ([facility], [facility], [facility] ONLY)  GC/CHLAMYDIA PROBE AMP ([place]) NOT [facility]    Imaging Review No results found. I have personally reviewed and evaluated these images and lab results as part of my medical decision-making.   EKG Interpretation None      MDM   Final diagnoses:  Vaginal irritation    Felicia Edwards is a 25 y.o. female presents to ED with complaint of vaginal irritation x 7 days. Some nausea, otherwise no abdominal symptoms. No urinary symptoms. Patient is afebrile and non-toxic. VSS. HCG negative. Pelvic exam shows no lacerations, ulcerations, or masses in vaginal cavity; cervix is close with mild amount of gray discharge noted. Wet prep shows no  Clue cells, yeast, or trich. GC/chlamydia/RPR pending. HIV negative. Suspect irritation is from dryness secondary to cleaning and use of scented soaps. Discussed discontinuing the use of scented soap, no baths, and no douching. Use plain white dove soap. Use rephresh nightly PRN for relief of vaginal irritation. Provided resources for establishing PCP. Discussed return precautions. Patient voiced understanding and is agreeable.     Lona KettleAshley Laurel Rakeb Kibble, [place]PA-C 07/20/15 1145  Nelva Nayobert Beaton, MD 07/20/15 [phone]

## 2015-07-20 NOTE — ED Notes (Signed)
Pt c/o vaginal irritation r/t possible yeast infection x 3 days ago.  Pain score 4/10.  Pt reports using Monistat 1 day treatment.  Denies current odor or discharge.  Sts "I think, I scrubbed too hard."  Denies dysuria.

## 2015-07-21 LAB — GC/CHLAMYDIA PROBE AMP (~~LOC~~) NOT AT ARMC
CHLAMYDIA, DNA PROBE: NEGATIVE
NEISSERIA GONORRHEA: NEGATIVE

## 2015-07-21 LAB — RPR: RPR Ser Ql: NONREACTIVE

## 2015-09-12 ENCOUNTER — Encounter (HOSPITAL_COMMUNITY): Payer: Self-pay | Admitting: Emergency Medicine

## 2015-09-12 ENCOUNTER — Emergency Department (HOSPITAL_COMMUNITY)
Admission: EM | Admit: 2015-09-12 | Discharge: 2015-09-12 | Disposition: A | Discharge status: Home / Self Care | Payer: No Typology Code available for payment source | Attending: Emergency Medicine | Admitting: Emergency Medicine

## 2015-09-12 DIAGNOSIS — N898 Other specified noninflammatory disorders of vagina: Secondary | ICD-10-CM

## 2015-09-12 DIAGNOSIS — F17210 Nicotine dependence, cigarettes, uncomplicated: Secondary | ICD-10-CM | POA: Insufficient documentation

## 2015-09-12 LAB — COMPREHENSIVE METABOLIC PANEL WITH GFR
ALT: 20 U/L (ref 14–54)
AST: 21 U/L (ref 15–41)
Albumin: 4.2 g/dL (ref 3.5–5.0)
Alkaline Phosphatase: 45 U/L (ref 38–126)
Anion gap: 7 (ref 5–15)
BUN: 12 mg/dL (ref 6–20)
CO2: 25 mmol/L (ref 22–32)
Calcium: 8.9 mg/dL (ref 8.9–10.3)
Chloride: 104 mmol/L (ref 101–111)
Creatinine, Ser: 0.81 mg/dL (ref 0.44–1.00)
GFR calc Af Amer: >60 mL/min
GFR calc non Af Amer: >60 mL/min
Glucose, Bld: 94 mg/dL (ref 65–99)
Potassium: 3.7 mmol/L (ref 3.5–5.1)
Sodium: 136 mmol/L (ref 135–145)
Total Bilirubin: 0.9 mg/dL (ref 0.3–1.2)
Total Protein: 7.5 g/dL (ref 6.5–8.1)

## 2015-09-12 LAB — CBC WITH DIFFERENTIAL/PLATELET
Basophils Absolute: 0 K/uL (ref 0.0–0.1)
Basophils Relative: 0 %
Eosinophils Absolute: 0.1 K/uL (ref 0.0–0.7)
Eosinophils Relative: 1 %
HCT: 37.9 % (ref 36.0–46.0)
Hemoglobin: 13.4 g/dL (ref 12.0–15.0)
Lymphocytes Relative: 40 %
Lymphs Abs: 1.8 K/uL (ref 0.7–4.0)
MCH: 31.5 pg (ref 26.0–34.0)
MCHC: 35.4 g/dL (ref 30.0–36.0)
MCV: 89 fL (ref 78.0–100.0)
Monocytes Absolute: 0.4 K/uL (ref 0.1–1.0)
Monocytes Relative: 9 %
Neutro Abs: 2.3 K/uL (ref 1.7–7.7)
Neutrophils Relative %: 50 %
Platelets: 241 K/uL (ref 150–400)
RBC: 4.26 MIL/uL (ref 3.87–5.11)
RDW: 12.6 % (ref 11.5–15.5)
WBC: 4.6 K/uL (ref 4.0–10.5)

## 2015-09-12 LAB — WET PREP, GENITAL
Clue Cells Wet Prep HPF POC: NONE SEEN
Sperm: NONE SEEN
Trich, Wet Prep: NONE SEEN
Yeast Wet Prep HPF POC: NONE SEEN

## 2015-09-12 LAB — URINALYSIS, ROUTINE W REFLEX MICROSCOPIC
Bilirubin Urine: NEGATIVE
Glucose, UA: NEGATIVE mg/dL
Hgb urine dipstick: NEGATIVE
Ketones, ur: NEGATIVE mg/dL
Leukocytes, UA: NEGATIVE
Nitrite: NEGATIVE
Protein, ur: NEGATIVE mg/dL
Specific Gravity, Urine: 1.029 (ref 1.005–1.030)
pH: 7 (ref 5.0–8.0)

## 2015-09-12 LAB — I-STAT BETA HCG BLOOD, ED (MC, WL, AP ONLY): I-stat hCG, quantitative: <5 m[IU]/mL

## 2015-09-12 LAB — LIPASE, BLOOD: Lipase: 22 U/L (ref 11–51)

## 2015-09-12 MED ORDER — CEFTRIAXONE SODIUM 250 MG IJ SOLR
250.0000 mg | Freq: Once | INTRAMUSCULAR | Status: AC
  Administered 2015-09-12: 250 mg via INTRAMUSCULAR
  Filled 2015-09-12: qty 250

## 2015-09-12 MED ORDER — STERILE WATER FOR INJECTION IJ SOLN
INTRAMUSCULAR | Status: AC
  Administered 2015-09-12: 10 mL
  Filled 2015-09-12: qty 10

## 2015-09-12 MED ORDER — AZITHROMYCIN 250 MG PO TABS
1000.0000 mg | ORAL_TABLET | Freq: Once | ORAL | Status: AC
  Administered 2015-09-12: 1000 mg via ORAL
  Filled 2015-09-12: qty 4

## 2015-09-12 NOTE — ED Provider Notes (Signed)
CSN: [id number]     Arrival date & time 09/12/15  0820 History   First MD Initiated Contact with Patient 09/12/15 0827     Chief Complaint  Patient presents with  . Vaginal Discharge  . Abdominal Pain     (Consider location/radiation/quality/duration/timing/severity/associated sxs/prior Treatment) HPI   Felicia Edwards is a 25 y.o F with no significant pmhx who presents to the ED today c/o lower abdominal pain and vaginal discharge. Pt states that for the last 3- 4 days she has been experiencing intermittent suprapubic abdominal pain that feels "like a cramp". Pain only lasts for 1 minute and then spontaneously resolves. Pt reports associated white vaginal discharge. Pt is sexually active with 1 partner. She does not use contraception. LMP was 1 month ago. Pt does say that she recently started using a new scented soap to wash her genital area and is concerned that this may be causing her symptoms. She denies any fevers, chills, N/V/D, melena, hematochezia, dysuria.   History reviewed. No pertinent past medical history. History reviewed. No pertinent past surgical history. History reviewed. No pertinent family history. Social History  Substance Use Topics  . Smoking status: Current Some Day Smoker    Types: Cigarettes  . Smokeless tobacco: Never Used  . Alcohol Use: Yes     Comment: occasionally   OB History    No data available     Review of Systems  All other systems reviewed and are negative.     Allergies  Review of patient's allergies indicates no known allergies.  Home Medications   Prior to Admission medications   Medication Sig Start Date End Date Taking? Authorizing Provider  Vaginal Lubricant ([facility]) GEL Place 1 application vaginally at bedtime as needed (vaginal irritation). 07/20/15  Yes Lona KettleAshley Laurel Meyer, PA-C  metroNIDAZOLE (FLAGYL) 500 MG tablet Take 1 tablet (500 mg total) by mouth 2 (two) times daily. Patient not taking: Reported on 07/20/2015 07/03/15    Lavera Guiseana Duo Liu, MD   BP 115/84 mmHg  Pulse 65  Temp(Src) 98.3 F (36.8 C) (Oral)  Resp 16  SpO2 100% Physical Exam  Constitutional: She is oriented to person, place, and time. She appears well-developed and well-nourished. No distress.  HENT:  Head: Normocephalic and atraumatic.  Eyes: Conjunctivae are normal. Right eye exhibits no discharge. Left eye exhibits no discharge. No scleral icterus.  Cardiovascular: Normal rate.   Pulmonary/Chest: Effort normal.  Abdominal: Soft. Bowel sounds are normal. She exhibits no distension and no mass. There is tenderness ( very mild suprapubic TTP). There is no rebound and no guarding.  Genitourinary: There is no rash, tenderness, lesion or injury on the right labia. There is no rash, tenderness, lesion or injury on the left labia. Cervix exhibits discharge. Cervix exhibits no motion tenderness and no friability. Right adnexum displays no mass, no tenderness and no fullness. Left adnexum displays no mass, no tenderness and no fullness. No tenderness or bleeding in the vagina. No foreign body around the vagina. Vaginal discharge found.  Neurological: She is alert and oriented to person, place, and time. Coordination normal.  Skin: Skin is warm and dry. No rash noted. She is not diaphoretic. No erythema. No pallor.  Psychiatric: She has a normal mood and affect. Her behavior is normal.  Nursing note and vitals reviewed.   ED Course  Procedures (including critical care time) Labs Review Labs Reviewed  WET PREP, GENITAL - Abnormal; Notable for the following:    WBC, Wet Prep HPF POC FEW (*)  All other components within normal limits  URINE CULTURE  COMPREHENSIVE METABOLIC PANEL  CBC WITH DIFFERENTIAL/PLATELET  LIPASE, BLOOD  URINALYSIS, ROUTINE W REFLEX MICROSCOPIC (NOT AT [facility])  RPR  HIV ANTIBODY (ROUTINE TESTING)  I-STAT BETA HCG BLOOD, ED ([facility], [facility], [facility] ONLY)  GC/CHLAMYDIA PROBE AMP ([place]) NOT AT [facility]    Imaging Review No results  found. I have personally reviewed and evaluated these images and lab results as part of my medical decision-making.   EKG Interpretation None      MDM   Final diagnoses:  Vaginal discharge    Otherwise healthy 25 y.o F presents to the ED today c/o intermittent lower abdominal cramping onset 4 days ago with associated white vaginal discharge. Pt appears well int he ED, non-toxic and non-septic appearing and is in NAD. Abd is soft and mildly tender over the suprapubic area. No adnexal tenderness. White vaginal discharge present on exam. Few WBC seen on wet prep. No yeast. All other lab work wnl. Doubt PID or TOA given lack of adnexal tenderness. Patient to be discharged with instructions to follow up with OBGYN. Discussed importance of using protection when sexually active. Pt understands that they have GC/Chlamydia cultures pending and that they will need to inform all sexual partners if results return positive. Pt has been treated prophylacticly with azithromycin and rocephin due to pts history, pelvic exam, and wet prep with increased WBCs. Pt not concerning for PID because hemodynamically stable and no cervical motion tenderness on pelvic exam.      Dub MikesSamantha Tripp Dowless, PA-C 09/13/15 [phone]  Dione Boozeavid Glick, MD 09/13/15 1445

## 2015-09-12 NOTE — Discharge Instructions (Signed)
Use regular water to bathe with. Avoid using scented soaps when washing genital area. You have gonorrhea and chlamydia cultures pending. These will result in approximately 72 hours. You will be notified if they are positive. Follow up with women's clinic if your symptoms do not improve. Return to the ED if you experience severe worsening of your symptoms, fevers, chills, increased abdominal pain, vaginal bleeding or increased discharge.

## 2015-09-12 NOTE — ED Notes (Signed)
Pt reports midline lower abd pain accompanied by white vaginal discharge for the past 4 days. No dysuria or n/v/d.

## 2015-09-13 LAB — URINE CULTURE: Culture: NO GROWTH

## 2015-09-13 LAB — HIV ANTIBODY (ROUTINE TESTING W REFLEX): HIV Screen 4th Generation wRfx: NONREACTIVE

## 2015-09-13 LAB — SYPHILIS: RPR W/REFLEX TO RPR TITER AND TREPONEMAL ANTIBODIES, TRADITIONAL SCREENING AND DIAGNOSIS ALGORITHM: RPR Ser Ql: NONREACTIVE

## 2015-10-18 ENCOUNTER — Emergency Department (HOSPITAL_COMMUNITY)
Admission: EM | Admit: 2015-10-18 | Discharge: 2015-10-18 | Disposition: A | Discharge status: Home / Self Care | Payer: Self-pay | Attending: Emergency Medicine | Admitting: Emergency Medicine

## 2015-10-18 ENCOUNTER — Encounter (HOSPITAL_COMMUNITY): Payer: Self-pay | Admitting: Emergency Medicine

## 2015-10-18 DIAGNOSIS — R110 Nausea: Secondary | ICD-10-CM | POA: Insufficient documentation

## 2015-10-18 DIAGNOSIS — F17210 Nicotine dependence, cigarettes, uncomplicated: Secondary | ICD-10-CM | POA: Insufficient documentation

## 2015-10-18 DIAGNOSIS — R1010 Upper abdominal pain, unspecified: Secondary | ICD-10-CM | POA: Insufficient documentation

## 2015-10-18 LAB — CBC
HEMATOCRIT: 39.7 % (ref 36.0–46.0)
HEMOGLOBIN: 13.7 g/dL (ref 12.0–15.0)
MCH: 31.9 pg (ref 26.0–34.0)
MCHC: 34.5 g/dL (ref 30.0–36.0)
MCV: 92.5 fL (ref 78.0–100.0)
Platelets: 235 10*3/uL (ref 150–400)
RBC: 4.29 MIL/uL (ref 3.87–5.11)
RDW: 12.6 % (ref 11.5–15.5)
WBC: 4 10*3/uL (ref 4.0–10.5)

## 2015-10-18 LAB — I-STAT BETA HCG BLOOD, ED (MC, WL, AP ONLY): I-stat hCG, quantitative: <5 m[IU]/mL (ref <5)

## 2015-10-18 LAB — LIPASE, BLOOD: LIPASE: 22 U/L (ref 11–51)

## 2015-10-18 LAB — URINALYSIS, ROUTINE W REFLEX MICROSCOPIC
Bilirubin Urine: NEGATIVE
GLUCOSE, UA: NEGATIVE mg/dL
KETONES UR: NEGATIVE mg/dL
LEUKOCYTES UA: NEGATIVE
Nitrite: NEGATIVE
PH: 6 (ref 5.0–8.0)
Protein, ur: NEGATIVE mg/dL
SPECIFIC GRAVITY, URINE: 1.03 (ref 1.005–1.030)

## 2015-10-18 LAB — COMPREHENSIVE METABOLIC PANEL
ALBUMIN: 4.4 g/dL (ref 3.5–5.0)
ALT: 22 U/L (ref 14–54)
AST: 22 U/L (ref 15–41)
Alkaline Phosphatase: 49 U/L (ref 38–126)
Anion gap: 5 (ref 5–15)
BUN: 12 mg/dL (ref 6–20)
CHLORIDE: 107 mmol/L (ref 101–111)
CO2: 25 mmol/L (ref 22–32)
Calcium: 9.1 mg/dL (ref 8.9–10.3)
Creatinine, Ser: 0.8 mg/dL (ref 0.44–1.00)
GFR calc Af Amer: >60 mL/min (ref >60)
GFR calc non Af Amer: >60 mL/min (ref >60)
GLUCOSE: 103 mg/dL — AB (ref 65–99)
POTASSIUM: 3.7 mmol/L (ref 3.5–5.1)
Sodium: 137 mmol/L (ref 135–145)
Total Bilirubin: 0.4 mg/dL (ref 0.3–1.2)
Total Protein: 7.7 g/dL (ref 6.5–8.1)

## 2015-10-18 LAB — URINE MICROSCOPIC-ADD ON

## 2015-10-18 MED ORDER — OMEPRAZOLE 20 MG PO CPDR: 20.0000 mg | DELAYED_RELEASE_CAPSULE | Freq: Every day | ORAL | 0 refills | Status: DC

## 2015-10-18 MED ORDER — GI COCKTAIL ~~LOC~~
30.0000 mL | Freq: Once | ORAL | Status: AC
  Administered 2015-10-18: 30 mL via ORAL
  Filled 2015-10-18: qty 30

## 2015-10-18 NOTE — Discharge Instructions (Signed)
As discussed, your evaluation today has been largely reassuring.  But, it is important that you monitor your condition carefully, and do not hesitate to return to the ED if you develop new, or concerning changes in your condition. ? ?Otherwise, please follow-up with your physician for appropriate ongoing care. ? ?

## 2015-10-18 NOTE — ED Triage Notes (Signed)
Pt c/o mid abdominal pain and nausea x 1 week. No emesis or diarrhea.

## 2015-10-18 NOTE — ED Provider Notes (Signed)
[facility]-EMERGENCY DEPT Provider Note   CSN: [id number] Arrival date & time: 10/18/15  9147  First Provider Contact:  First MD Initiated Contact with Patient 10/18/15 0802        History   Chief Complaint Chief Complaint  Patient presents with  . Abdominal Pain    HPI Felicia Edwards is a 25 y.o. female.  HPI Patient presents with concerns of ongoing abdominal pain, nausea. Symptoms began about one week ago, without clear precipitant. Since onset she has had multiple episodes of upper abdominal discomfort, described as sore, unsettled. There is associated nausea. Patient has not had any vomiting, nor diarrhea. Fevers, chills. No medication taken for relief. She denies history of GERD, or gastritis. Patient drinks socially, smokes.   History reviewed. No pertinent past medical history.  There are no active problems to display for this patient.   History reviewed. No pertinent surgical history.  OB History    No data available       Home Medications    Prior to Admission medications   Medication Sig Start Date End Date Taking? Authorizing Provider  metroNIDAZOLE (FLAGYL) 500 MG tablet Take 1 tablet (500 mg total) by mouth 2 (two) times daily. Patient not taking: Reported on 07/20/2015 07/03/15   Lavera Guise, MD  Vaginal Lubricant [facility]) GEL Place 1 application vaginally at bedtime as needed (vaginal irritation). Patient not taking: Reported on 10/18/2015 07/20/15   Lona Kettle, PA-C    Family History No family history on file.  Social History Social History  Substance Use Topics  . Smoking status: Current Some Day Smoker    Packs/day: 1.00    Types: Cigarettes  . Smokeless tobacco: Never Used  . Alcohol use Yes     Comment: occasionally     Allergies   Review of patient's allergies indicates no known allergies.   Review of Systems Review of Systems  Constitutional:       Per HPI, otherwise negative  HENT:       Per HPI, otherwise  negative  Respiratory:       Per HPI, otherwise negative  Cardiovascular:       Per HPI, otherwise negative  Gastrointestinal: Positive for nausea. Negative for vomiting.  Endocrine:       Negative aside from HPI  Genitourinary:       Neg aside from HPI   Musculoskeletal:       Per HPI, otherwise negative  Skin: Negative.   Neurological: Negative for syncope.     Physical Exam Updated Vital Signs BP 135/89 (BP Location: Left Arm)   Pulse 88   Temp 98.6 F (37 C) (Oral)   Resp 16   Ht  (1.575 m)   Wt 155 lb (70.3 kg)   SpO2 100%   BMI 28.35 kg/m   Physical Exam  Constitutional: She is oriented to person, place, and time. She appears well-developed and well-nourished. No distress.  HENT:  Head: Normocephalic and atraumatic.  Eyes: Conjunctivae and EOM are normal.  Cardiovascular: Normal rate and regular rhythm.   Pulmonary/Chest: Effort normal and breath sounds normal. No stridor. No respiratory distress.  Abdominal: Bowel sounds are normal. She exhibits no distension.  Not ttp, but the patient states that the epigastrum is where she has discomfort  Musculoskeletal: She exhibits no edema.  Neurological: She is alert and oriented to person, place, and time. No cranial nerve deficit.  Skin: Skin is warm and dry.  Psychiatric: She has a normal mood and  affect.  Nursing note and vitals reviewed.    ED Treatments / Results  Labs (all labs ordered are listed, but only abnormal results are displayed) Labs Reviewed  COMPREHENSIVE METABOLIC PANEL - Abnormal; Notable for the following:       Result Value   Glucose, Bld 103 (*)    All other components within normal limits  URINALYSIS, ROUTINE W REFLEX MICROSCOPIC (NOT AT [facility]) - Abnormal; Notable for the following:    Hgb urine dipstick MODERATE (*)    All other components within normal limits  URINE MICROSCOPIC-ADD ON - Abnormal; Notable for the following:    Squamous Epithelial / LPF 0-5 (*)    Bacteria, UA FEW  (*)    All other components within normal limits  LIPASE, BLOOD  CBC  I-STAT BETA HCG BLOOD, ED ([facility], [facility], [facility] ONLY)     Procedures Procedures (including critical care time)  Medications Ordered in ED Medications  gi cocktail (Maalox,Lidocaine,Donnatal) (30 mLs Oral Given 10/18/15 0942)     Initial Impression / Assessment and Plan / ED Course  I have reviewed the triage vital signs and the nursing notes.  Pertinent labs & imaging results that were available during my care of the patient were reviewed by me and considered in my medical decision making (see chart for details).  Clinical Course  On repeat exam patient is in no distress per We discussed all findings. No new complaints. Vital signs remained stable. We discussed the need to initiate therapy for presumed*esophageal etiology given her upper abdominal pain, and to follow up with GI as needed.   Final Clinical Impressions(s) / ED Diagnoses  And female presenting with upper abdominal pain, nausea, but no vomiting, diarrhea, fever, chills, no other abdominal pain suggesting cholecystitis or appendicitis. No lower abdominal pain suggesting GU pathology. With reassuring labs, vitals, patient was started on medication, will follow-up with GI.   Gerhard Munchobert Ary Rudnick, MD 10/18/15 1009

## 2016-06-28 IMAGING — US US TRANSVAGINAL NON-OB
1 series · 14 of 25 positions shown · non-contrast
Comparison: None.

CLINICAL DATA: Left pelvic pain x2 weeks, left adnexal tenderness

EXAM:
TRANSABDOMINAL AND TRANSVAGINAL ULTRASOUND OF PELVIS
DOPPLER ULTRASOUND OF OVARIES
TECHNIQUE: Both transabdominal and transvaginal ultrasound examinations of the
pelvis were performed. Transabdominal technique was performed for
global imaging of the pelvis including uterus, ovaries, adnexal
regions, and pelvic cul-de-sac.
It was necessary to proceed with endovaginal exam following the
transabdominal exam to visualize the bilateral ovaries. Color and
duplex Doppler ultrasound was utilized to evaluate blood flow to the
ovaries.

[Series 1: us transvaginal non-ob · 0.21mm/px · 64 acquisitions, 14 frames shown]
[im 1/64]
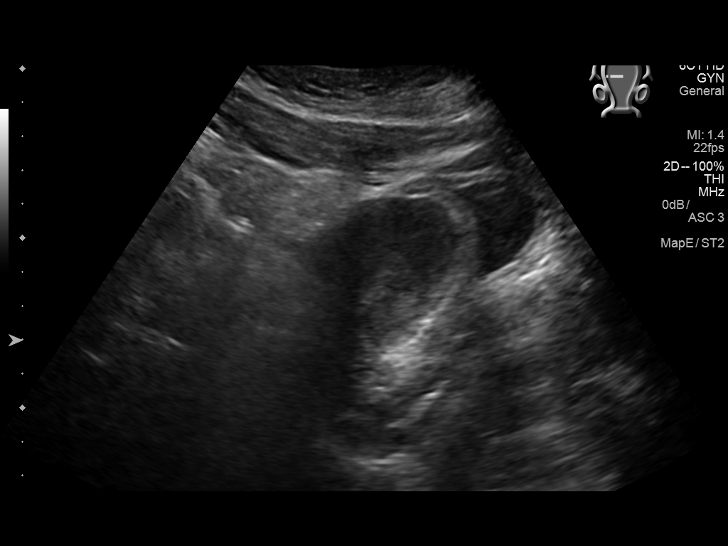
[im 6/64]
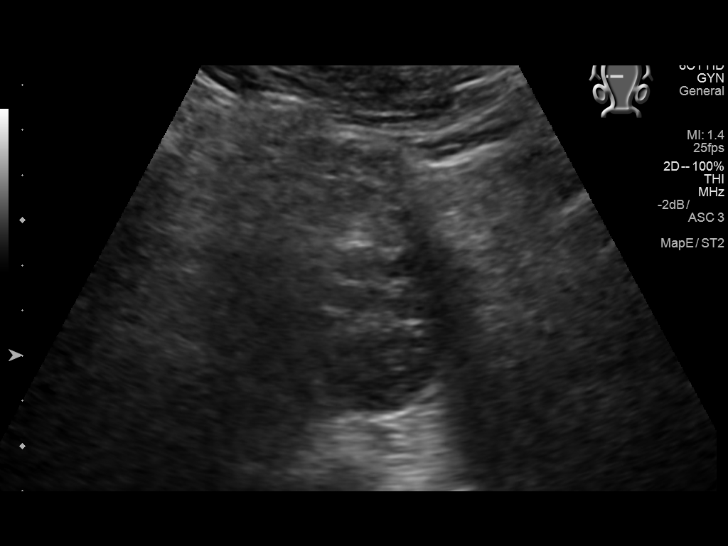
[im 11/64]
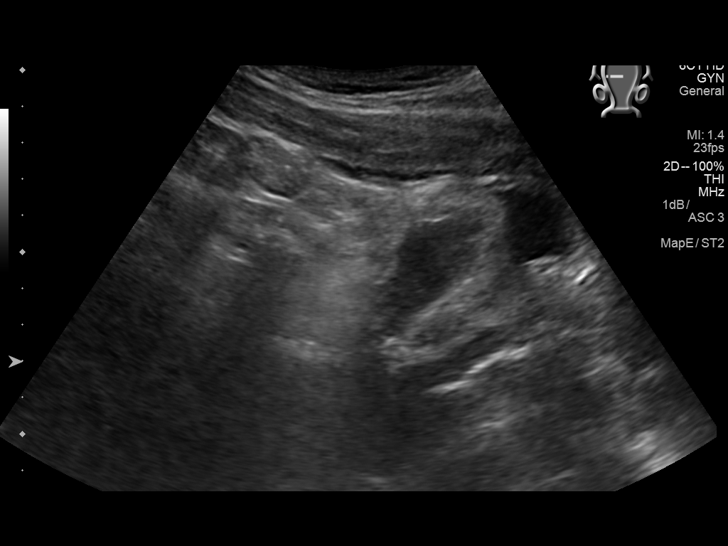
[im 16/64]
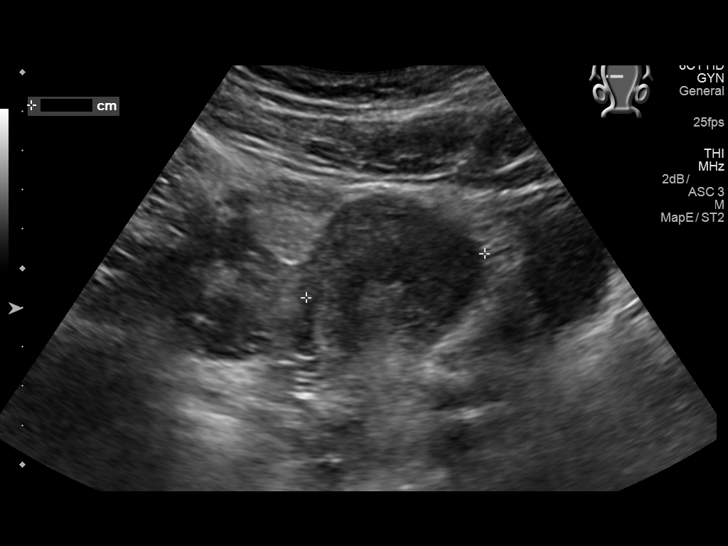
[im 22/64]
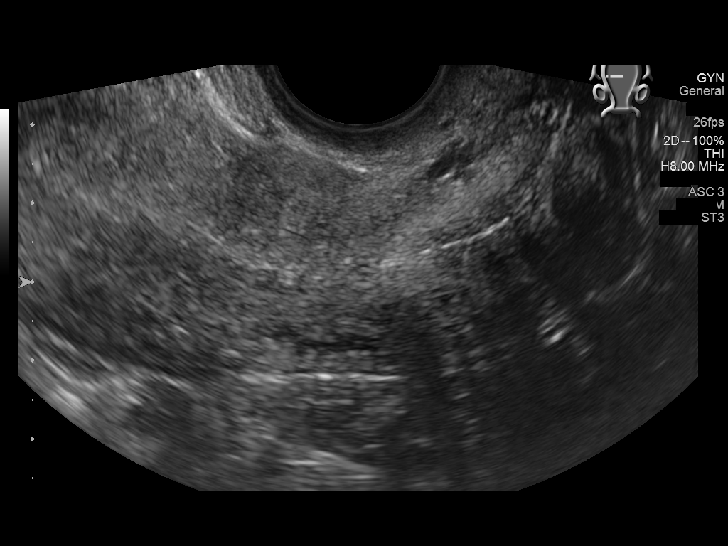
[im 24/64]
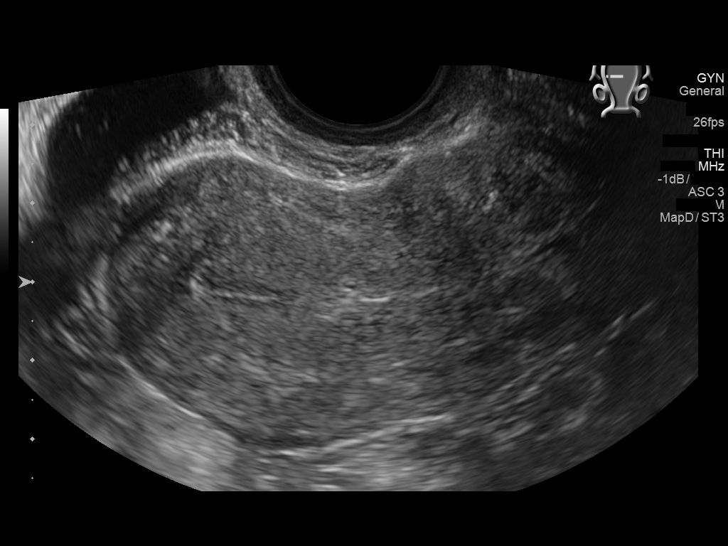
[im 29/64]
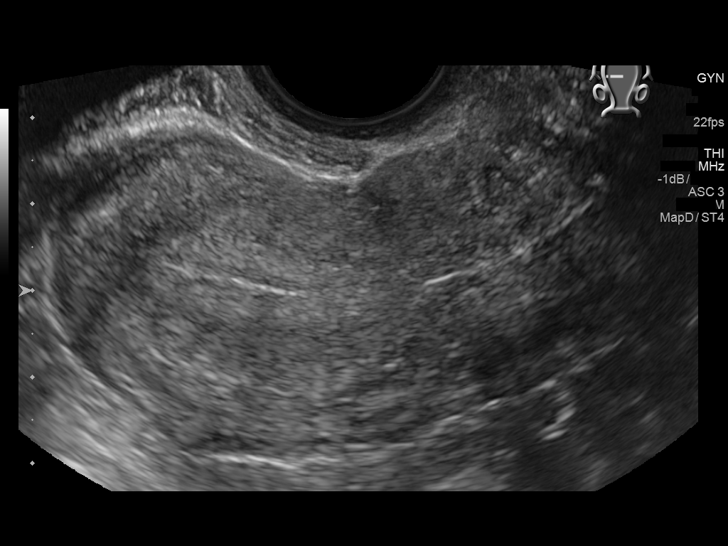
[im 35/64]
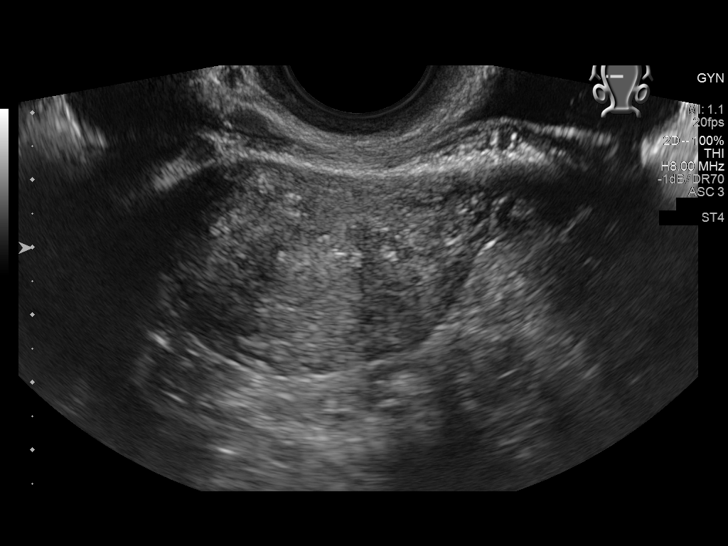
[im 40/64]
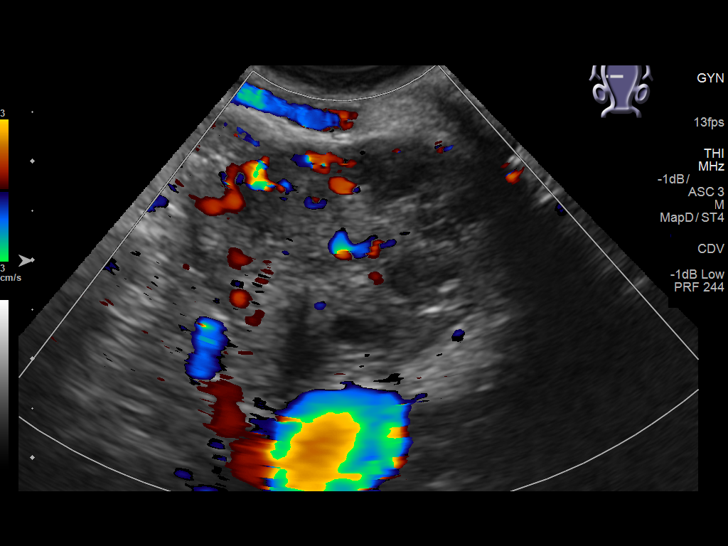
[im 43/64]
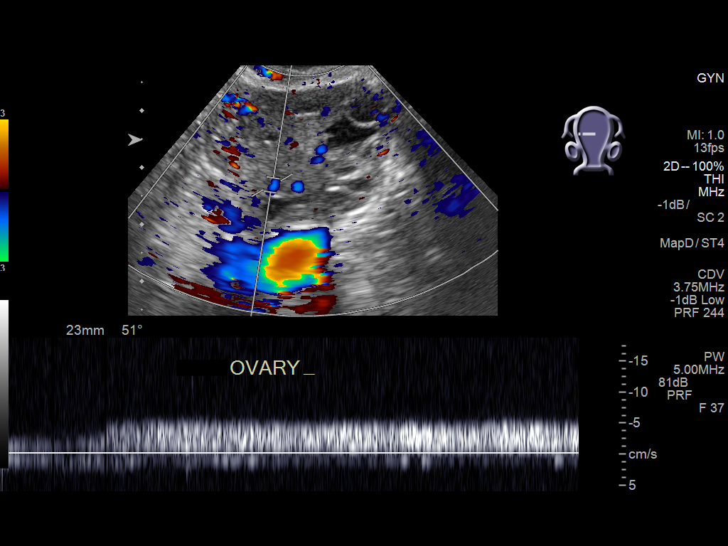
[im 48/64]
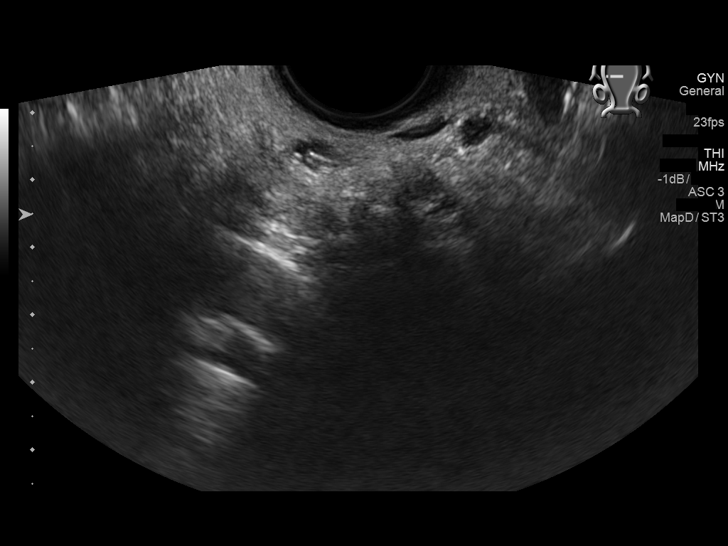
[im 53/64]
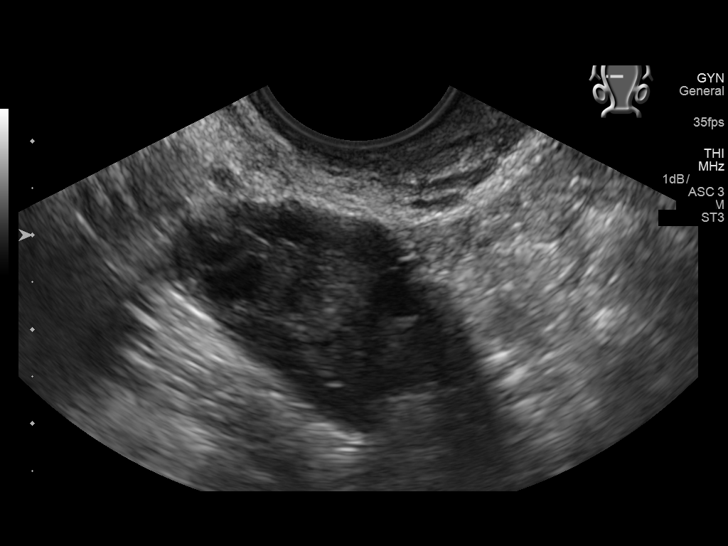
[im 58/64]
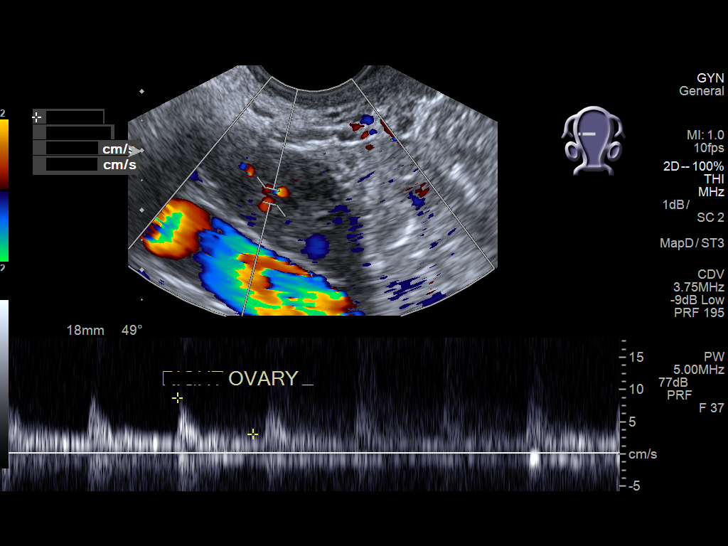
[im 64/64]
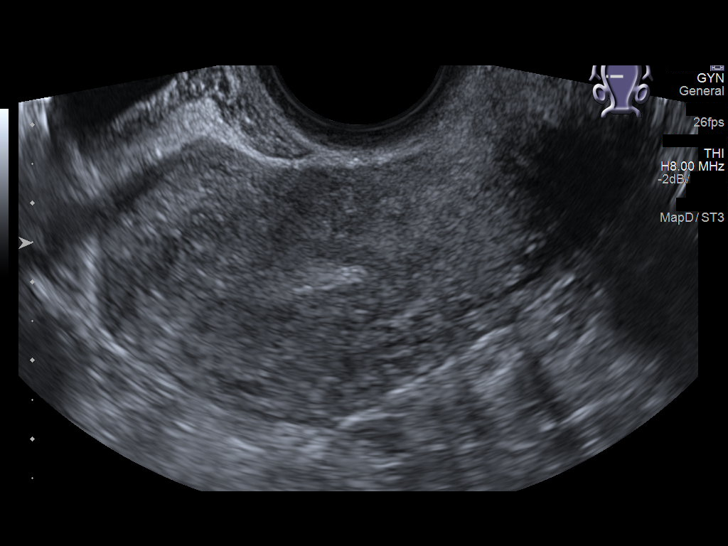

[14 of 25 positions shown; findings below may reference images not displayed]

FINDINGS: Uterus

Measurements: 8.1 x 4.2 x 4.7 cm. No fibroids or other mass
visualized.

Endometrium

Thickness: 10 mm.  No focal abnormality visualized.

Right ovary

Measurements: 4.0 x 1.9 x 2.7 cm. Normal appearance/no adnexal mass.

Left ovary

Measurements: 3.3 x 2.3 x 2.9 cm. Normal appearance/no adnexal mass.

Pulsed Doppler evaluation of both ovaries demonstrates normal
low-resistance arterial and venous waveforms.

Other findings

No abnormal free fluid.
IMPRESSION: Negative pelvic ultrasound.

No evidence of ovarian torsion.

## 2016-11-07 ENCOUNTER — Emergency Department (HOSPITAL_COMMUNITY)
Admission: EM | Admit: 2016-11-07 | Discharge: 2016-11-07 | Discharge status: Against Medical Advice | Payer: BLUE CROSS/BLUE SHIELD | Attending: Emergency Medicine | Admitting: Emergency Medicine

## 2016-11-07 ENCOUNTER — Emergency Department (HOSPITAL_COMMUNITY): Payer: BLUE CROSS/BLUE SHIELD

## 2016-11-07 ENCOUNTER — Encounter: Payer: Self-pay | Admitting: Emergency Medicine

## 2016-11-07 DIAGNOSIS — N898 Other specified noninflammatory disorders of vagina: Secondary | ICD-10-CM | POA: Insufficient documentation

## 2016-11-07 DIAGNOSIS — R102 Pelvic and perineal pain: Secondary | ICD-10-CM | POA: Diagnosis not present

## 2016-11-07 DIAGNOSIS — Z79899 Other long term (current) drug therapy: Secondary | ICD-10-CM | POA: Insufficient documentation

## 2016-11-07 DIAGNOSIS — F17210 Nicotine dependence, cigarettes, uncomplicated: Secondary | ICD-10-CM | POA: Diagnosis not present

## 2016-11-07 LAB — WET PREP, GENITAL
Clue Cells Wet Prep HPF POC: NONE SEEN
SPERM: NONE SEEN
Trich, Wet Prep: NONE SEEN
YEAST WET PREP: NONE SEEN

## 2016-11-07 LAB — CBC WITH DIFFERENTIAL/PLATELET
Basophils Absolute: 0 10*3/uL (ref 0.0–0.1)
Basophils Relative: 0 %
Eosinophils Absolute: 0 10*3/uL (ref 0.0–0.7)
Eosinophils Relative: 1 %
HCT: 38.2 % (ref 36.0–46.0)
HEMOGLOBIN: 13.4 g/dL (ref 12.0–15.0)
LYMPHS ABS: 1.2 10*3/uL (ref 0.7–4.0)
LYMPHS PCT: 24 %
MCH: 31.8 pg (ref 26.0–34.0)
MCHC: 35.1 g/dL (ref 30.0–36.0)
MCV: 90.5 fL (ref 78.0–100.0)
MONO ABS: 0.4 10*3/uL (ref 0.1–1.0)
Monocytes Relative: 8 %
NEUTROS ABS: 3.3 10*3/uL (ref 1.7–7.7)
Neutrophils Relative %: 67 %
Platelets: 225 10*3/uL (ref 150–400)
RBC: 4.22 MIL/uL (ref 3.87–5.11)
RDW: 12.9 % (ref 11.5–15.5)
WBC: 4.9 10*3/uL (ref 4.0–10.5)

## 2016-11-07 LAB — URINALYSIS, ROUTINE W REFLEX MICROSCOPIC
BILIRUBIN URINE: NEGATIVE
Glucose, UA: NEGATIVE mg/dL
HGB URINE DIPSTICK: NEGATIVE
KETONES UR: NEGATIVE mg/dL
Leukocytes, UA: NEGATIVE
NITRITE: NEGATIVE
Protein, ur: NEGATIVE mg/dL
Specific Gravity, Urine: 1.021 (ref 1.005–1.030)
pH: 6 (ref 5.0–8.0)

## 2016-11-07 LAB — I-STAT BETA HCG BLOOD, ED (MC, WL, AP ONLY)

## 2016-11-07 MED ORDER — KETOROLAC TROMETHAMINE 60 MG/2ML IM SOLN
30.0000 mg | Freq: Once | INTRAMUSCULAR | Status: AC
  Administered 2016-11-07: 30 mg via INTRAMUSCULAR
  Filled 2016-11-07: qty 2

## 2016-11-07 NOTE — ED Triage Notes (Signed)
Pt states that she has had lower pelvic pain x 1 week w/ white vaginal d/c. States she has a hx of BV but has not had her period this month either. Alert and oriented.

## 2016-11-07 NOTE — ED Provider Notes (Signed)
[facility]-EMERGENCY DEPT Provider Note   CSN: [id number] Arrival date & time: 11/07/16  0818     History   Chief Complaint Chief Complaint  Patient presents with  . Pelvic Pain  . Vaginal Discharge    HPI Felicia Edwards is a 26 y.o. G1 P1 who presents to the ED with pelvic pain and vaginal d/c. The pain and d/c started about a month ago and has gotten worse.  Patient is sexually active with one female partner x 2 years. Unprotected sex. Patient is not on any birth control. Hx of Chlamydia.   The history is provided by the patient. No language interpreter was used.  Pelvic Pain  This is a new problem. The current episode started more than 1 week ago. Episode frequency: comes and goes. The problem has been gradually worsening. Pertinent negatives include no chest pain and no headaches. Nothing aggravates the symptoms. Nothing relieves the symptoms. She has tried nothing for the symptoms.  Vaginal Discharge   This is a new problem. The current episode started more than 1 week ago. The problem occurs constantly. The problem has not changed since onset.The discharge occurs spontaneously. The discharge was white and milky. She has missed her period. Associated symptoms include frequency and perineal odor. Pertinent negatives include no anorexia, no diaphoresis, no fever, no diarrhea, no nausea, no vomiting, no dyspareunia, no dysuria, no genital burning, no genital itching and no genital lesions. She has tried nothing for the symptoms. Her past medical history is significant for irregular periods and STD (Chlamydia). Her past medical history does not include ectopic pregnancy or ovarian cysts.    No past medical history on file.  There are no active problems to display for this patient.   No past surgical history on file.  OB History    No data available       Home Medications    Prior to Admission medications   Medication Sig Start Date End Date Taking? Authorizing Provider    metroNIDAZOLE (FLAGYL) 500 MG tablet Take 1 tablet (500 mg total) by mouth 2 (two) times daily. Patient not taking: Reported on 07/20/2015 07/03/15   Lavera GuiseLiu, Dana Duo, MD  omeprazole (PRILOSEC) 20 MG capsule Take 1 capsule (20 mg total) by mouth daily. 10/18/15   Gerhard MunchLockwood, Robert, MD  Vaginal Lubricant [facility](REPHRESH) GEL Place 1 application vaginally at bedtime as needed (vaginal irritation). Patient not taking: Reported on 10/18/2015 07/20/15   Deborha PaymentMeyer, Ashley L, PA-C    Family History No family history on file.  Social History Social History  Substance Use Topics  . Smoking status: Current Some Day Smoker    Packs/day: 1.00    Types: Cigarettes  . Smokeless tobacco: Never Used  . Alcohol use Yes     Comment: occasionally     Allergies   Patient has no known allergies.   Review of Systems Review of Systems  Constitutional: Negative for diaphoresis and fever.  HENT: Negative.   Eyes: Negative for pain, discharge, itching and visual disturbance.  Respiratory: Negative for cough and wheezing.   Cardiovascular: Negative for chest pain and leg swelling.  Gastrointestinal: Negative for anorexia, diarrhea, nausea and vomiting.  Genitourinary: Positive for frequency, pelvic pain and vaginal discharge. Negative for dyspareunia, dysuria, hematuria, urgency, vaginal bleeding and vaginal pain.  Musculoskeletal: Negative for back pain and joint swelling.  Skin: Negative for rash.  Neurological: Negative for syncope and headaches.  Hematological: Negative for adenopathy.  Psychiatric/Behavioral: Negative for confusion. The patient is not nervous/anxious.  Physical Exam Updated Vital Signs BP 118/79 (BP Location: Right Arm)   Pulse 62   Temp 98.4 F (36.9 C) (Oral)   Resp 16   LMP 09/26/2016 (Approximate)   SpO2 100%   Physical Exam  Constitutional: She is oriented to person, place, and time. She appears well-developed and well-nourished. No distress.  HENT:  Head: Normocephalic and  atraumatic.  Eyes: EOM are normal.  Neck: Neck supple.  Cardiovascular: Normal rate and regular rhythm.   Pulmonary/Chest: Effort normal and breath sounds normal.  Abdominal: Soft. There is tenderness in the right lower quadrant, suprapubic area and left lower quadrant. There is no rebound, no guarding and no CVA tenderness.  Genitourinary:  Genitourinary Comments: External genitalia without lesions, white d/c vaginal vault. Positive CMT bilateral adnexal tenderness. Uterus not enlarged.   Musculoskeletal: Normal range of motion.  Neurological: She is alert and oriented to person, place, and time. No cranial nerve deficit.  Skin: Skin is warm and dry.  Psychiatric: She has a normal mood and affect. Her behavior is normal.  Nursing note and vitals reviewed.    ED Treatments / Results  Labs (all labs ordered are listed, but only abnormal results are displayed) Labs Reviewed  WET PREP, GENITAL - Abnormal; Notable for the following:       Result Value   WBC, Wet Prep HPF POC RARE (*)    All other components within normal limits  GC/CHLAMYDIA PROBE AMP ([place]) NOT AT [facility] - Abnormal; Notable for the following:    Chlamydia **POSITIVE** (*)    All other components within normal limits  URINALYSIS, ROUTINE W REFLEX MICROSCOPIC  CBC WITH DIFFERENTIAL/PLATELET  RPR  HIV ANTIBODY (ROUTINE TESTING)  I-STAT BETA HCG BLOOD, ED ([facility], [facility], [facility] ONLY)   Radiology No results found.  Procedures Procedures (including critical care time)  Medications Ordered in ED Medications  ketorolac (TORADOL) injection 30 mg (30 mg Intramuscular Given 11/07/16 1259)     Initial Impression / Assessment and Plan / ED Course  I have reviewed the triage vital signs and the nursing notes.    Final Clinical Impressions(s) / ED Diagnoses   Final diagnoses:  Pelvic pain   NOTE: patient left prior to getting ultrasound stating she had to leave to pick up her child. Patient signed out AMA. New  Prescriptions Discharge Medication List as of 11/07/2016  1:05 PM       Kerrie Buffalo Jackson, NP 11/09/16 0019    Tilden Fossa, MD 11/09/16 [phone]

## 2016-11-08 LAB — HIV ANTIBODY (ROUTINE TESTING W REFLEX): HIV SCREEN 4TH GENERATION: NONREACTIVE

## 2016-11-08 LAB — GC/CHLAMYDIA PROBE AMP (~~LOC~~) NOT AT ARMC
CHLAMYDIA, DNA PROBE: POSITIVE — AB
Neisseria Gonorrhea: NEGATIVE

## 2016-11-08 LAB — RPR: RPR: NONREACTIVE

## 2016-11-09 ENCOUNTER — Emergency Department (HOSPITAL_COMMUNITY)
Admission: EM | Admit: 2016-11-09 | Discharge: 2016-11-09 | Disposition: A | Discharge status: Home / Self Care | Payer: BLUE CROSS/BLUE SHIELD | Attending: Emergency Medicine | Admitting: Emergency Medicine

## 2016-11-09 ENCOUNTER — Encounter (HOSPITAL_COMMUNITY): Payer: Self-pay | Admitting: Emergency Medicine

## 2016-11-09 DIAGNOSIS — Z79899 Other long term (current) drug therapy: Secondary | ICD-10-CM | POA: Diagnosis not present

## 2016-11-09 DIAGNOSIS — A749 Chlamydial infection, unspecified: Secondary | ICD-10-CM

## 2016-11-09 DIAGNOSIS — F17210 Nicotine dependence, cigarettes, uncomplicated: Secondary | ICD-10-CM | POA: Diagnosis not present

## 2016-11-09 DIAGNOSIS — N898 Other specified noninflammatory disorders of vagina: Secondary | ICD-10-CM | POA: Diagnosis present

## 2016-11-09 DIAGNOSIS — A5602 Chlamydial vulvovaginitis: Secondary | ICD-10-CM | POA: Insufficient documentation

## 2016-11-09 MED ORDER — AZITHROMYCIN 500 MG PO TABS: 1000.0000 mg | ORAL_TABLET | Freq: Once | ORAL | 0 refills | Status: AC

## 2016-11-09 MED ORDER — ONDANSETRON HCL 4 MG PO TABS: 4.0000 mg | ORAL_TABLET | Freq: Three times a day (TID) | ORAL | 0 refills | Status: DC | PRN

## 2016-11-09 NOTE — Discharge Instructions (Signed)
Read the information below.  Use the prescribed medication as directed.  Please discuss all new medications with your pharmacist.  You may return to the Emergency Department at any time for worsening condition or any new symptoms that concern you.    It is important that both you and your sexual partner are treated.  Do not have sex for 7 days following both of you being treated.  If you develop fevers, abdominal pain, nausea and vomiting, any worsening symptoms, return immediately to the emergency department for recheck.

## 2016-11-09 NOTE — ED Triage Notes (Signed)
Pt requesting results from previous visit on 11/07/16. Does not want examination or further workup. Was seen for pelvic pain, but pain resolved after injection in ED. Attempted to see results on MyChart but had difficulty viewing results.

## 2016-11-09 NOTE — ED Provider Notes (Signed)
[facility]-EMERGENCY DEPT Provider Note   CSN: [id number] Arrival date & time: 11/09/16  1109     History   Chief Complaint Chief Complaint  Patient presents with  . Wants Results from Previous Visit    HPI Felicia Edwards is a 26 y.o. female.  HPI   Patient with hx chlamydia p/w request for results from prior visit. She is having trouble accessing my chart and was not contacted with results.  Pt was seen 11/07/16 (2 days ago) for pelvic pain.  States the toradol completely relieved her pain.  Has had abnormal white discharge.  Denies any worsening symptoms.  Denies fevers, N/V, abdominal or pelvic pain, abnormal vaginal bleeding.  States she has had intermittent pelvic cramping since her daughter was born (years ago).    Chart review shows patient left AMA after pelvic exam but before planned US.  Her test results were positive for chlamydia.    History reviewed. No pertinent past medical history.  There are no active problems to display for this patient.   History reviewed. No pertinent surgical history.  OB History    No data available       Home Medications    Prior to Admission medications   Medication Sig Start Date End Date Taking? Authorizing Provider  azithromycin (ZITHROMAX) 500 MG tablet Take 2 tablets (1,000 mg total) by mouth once. 11/09/16 11/09/16  Trixie DredgeWest, Kenlee Maler, PA-C  metroNIDAZOLE (FLAGYL) 500 MG tablet Take 1 tablet (500 mg total) by mouth 2 (two) times daily. Patient not taking: Reported on 07/20/2015 07/03/15   Lavera GuiseLiu, Dana Duo, MD  omeprazole (PRILOSEC) 20 MG capsule Take 1 capsule (20 mg total) by mouth daily. 10/18/15   Gerhard MunchLockwood, Robert, MD  ondansetron (ZOFRAN) 4 MG tablet Take 1 tablet (4 mg total) by mouth every 8 (eight) hours as needed for nausea or vomiting. 11/09/16   Trixie DredgeWest, Deshun Sedivy, PA-C  Vaginal Lubricant (REPHRESH) GEL Place 1 application vaginally at bedtime as needed (vaginal irritation). Patient not taking: Reported on 10/18/2015 07/20/15   Deborha PaymentMeyer, Ashley L,  PA-C    Family History History reviewed. No pertinent family history.  Social History Social History  Substance Use Topics  . Smoking status: Current Some Day Smoker    Packs/day: 1.00    Types: Cigarettes  . Smokeless tobacco: Never Used  . Alcohol use Yes     Comment: occasionally     Allergies   Patient has no known allergies.   Review of Systems Review of Systems  Constitutional: Negative for appetite change, chills and fever.  Gastrointestinal: Negative for abdominal pain, nausea and vomiting.  Genitourinary: Positive for vaginal discharge. Negative for pelvic pain, vaginal bleeding and vaginal pain.     Physical Exam Updated Vital Signs BP (!) 131/92 (BP Location: Right Arm)   Pulse 91   Temp 98.3 F (36.8 C) (Oral)   Resp 16   SpO2 100%   Physical Exam  Constitutional: She appears well-developed and well-nourished. No distress.  HENT:  Head: Normocephalic and atraumatic.  Neck: Neck supple.  Pulmonary/Chest: Effort normal.  Abdominal: Soft. She exhibits no distension and no mass. There is no tenderness. There is no rebound and no guarding.  Neurological: She is alert.  Skin: She is not diaphoretic.  Nursing note and vitals reviewed.    ED Treatments / Results  Labs (all labs ordered are listed, but only abnormal results are displayed) Labs Reviewed - No data to display  EKG  EKG Interpretation None  Radiology No results found.  Procedures Procedures (including critical care time)  Medications Ordered in ED Medications - No data to display   Initial Impression / Assessment and Plan / ED Course  I have reviewed the triage vital signs and the nursing notes.  Pertinent labs & imaging results that were available during my care of the patient were reviewed by me and considered in my medical decision making (see chart for details).     Afebrile, nontoxic patient with abnormal vaginal discharge, seen previously, returns to get  results.  Denies any current worrisome symptoms.  Abdominal exam is benign.  Given patient's description and current lack of symptoms, doubt PID.  I engaged patient in dialogue regarding this and use joint medical decision making for decision regarding antibiotics and length of treatment.  Pt advised that sexual partner(s) must be tested and treated.  Pt also advised to seek reexamination if symptoms not cleared or for any worsening symptoms after treatment.  Pt declined PO treatment in ED, requested prescriptions for home since she drank ETOH last night and hadn't eaten today.  D/C home with 1 g azithromycin, zofran Rx.   Discussed result, findings, treatment, and follow up  with patient.  Pt given return precautions.  Pt verbalizes understanding and agrees with plan.       Final Clinical Impressions(s) / ED Diagnoses   Final diagnoses:  Chlamydia    New Prescriptions Discharge Medication List as of 11/09/2016 12:11 PM    START taking these medications   Details  azithromycin (ZITHROMAX) 500 MG tablet Take 2 tablets (1,000 mg total) by mouth once., Starting Fri 11/09/2016, Print    ondansetron (ZOFRAN) 4 MG tablet Take 1 tablet (4 mg total) by mouth every 8 (eight) hours as needed for nausea or vomiting., Starting Fri 11/09/2016, Print         Trixie Dredge, PA-C 11/09/16 1231    Azalia Bilis, MD 11/09/16 1719

## 2018-01-19 ENCOUNTER — Other Ambulatory Visit: Payer: Self-pay

## 2018-01-19 ENCOUNTER — Encounter (HOSPITAL_COMMUNITY): Payer: Self-pay | Admitting: *Deleted

## 2018-01-19 ENCOUNTER — Emergency Department (HOSPITAL_COMMUNITY)
Admission: EM | Admit: 2018-01-19 | Discharge: 2018-01-19 | Disposition: A | Discharge status: Home / Self Care | Payer: BLUE CROSS/BLUE SHIELD | Attending: Emergency Medicine | Admitting: Emergency Medicine

## 2018-01-19 DIAGNOSIS — X500XXA Overexertion from strenuous movement or load, initial encounter: Secondary | ICD-10-CM | POA: Insufficient documentation

## 2018-01-19 DIAGNOSIS — F17210 Nicotine dependence, cigarettes, uncomplicated: Secondary | ICD-10-CM | POA: Insufficient documentation

## 2018-01-19 DIAGNOSIS — M545 Low back pain, unspecified: Secondary | ICD-10-CM

## 2018-01-19 DIAGNOSIS — Z79899 Other long term (current) drug therapy: Secondary | ICD-10-CM | POA: Insufficient documentation

## 2018-01-19 MED ORDER — METHOCARBAMOL 500 MG PO TABS: 500.0000 mg | ORAL_TABLET | Freq: Three times a day (TID) | ORAL | 0 refills | Status: DC | PRN

## 2018-01-19 MED ORDER — NAPROXEN 500 MG PO TABS: 500.0000 mg | ORAL_TABLET | Freq: Two times a day (BID) | ORAL | 0 refills | Status: DC

## 2018-01-19 MED ORDER — NAPROXEN 500 MG PO TABS
500.0000 mg | ORAL_TABLET | Freq: Once | ORAL | Status: AC
  Administered 2018-01-19: 500 mg via ORAL
  Filled 2018-01-19: qty 1

## 2018-01-19 NOTE — ED Triage Notes (Signed)
Pt stated "I hurt my back yesterday @ work.  I reported it."  Pt c/o lower back pain.

## 2018-01-19 NOTE — Discharge Instructions (Signed)
Back Pain:  I have prescribed you an anti-inflammatory medication and a muscle relaxer.  - Naproxen is a nonsteroidal anti-inflammatory medication that will help with pain and swelling. Be sure to take this medication as prescribed with food, 1 pill every 12 hours,  It should be taken with food, as it can cause stomach upset, and more seriously, stomach bleeding. Do not take other nonsteroidal anti-inflammatory medications with this such as Advil, Motrin, Aleve, Mobic, Goodie Powder, or Motrin.    - Robaxin is the muscle relaxer I have prescribed, this is meant to help with muscle tightness. Be aware that this medication may make you drowsy therefore the first time you take this it should be at a time you are in an environment where you can rest. Do not drive or operate heavy machinery when taking this medication. Do not drink alcohol or take other sedating medications with this medicine such as narcotics or benzodiazepines.   You make take Tylenol per over the counter dosing with these medications.   We have prescribed you new medication(s) today. Discuss the medications prescribed today with your pharmacist as they can have adverse effects and interactions with your other medicines including over the counter and prescribed medications. Seek medical evaluation if you start to experience new or abnormal symptoms after taking one of these medicines, seek care immediately if you start to experience difficulty breathing, feeling of your throat closing, facial swelling, or rash as these could be indications of a more serious allergic reaction   Your pain should get better over the next 2 weeks.  You will need to follow up with  Your primary healthcare provider in 1-2 weeks for reassessment, if you do not have a primary care provider one is provided in your discharge instructions. However if you develop severe or worsening pain, low back pain with fever, numbness, weakness, loss of bowel or bladder control,  or inability to walk or urinate, you should return to the ER immediately.  Please follow up with your doctor this week for a recheck if still having symptoms.

## 2018-01-19 NOTE — ED Provider Notes (Signed)
[facility]-EMERGENCY DEPT Provider Note   CSN: [id number] Arrival date & time: 01/19/18  2202     History   Chief Complaint Chief Complaint  Patient presents with  . Back Pain    HPI Felicia Edwards is a 27 y.o. female with a hx of tobacco abuse who presents to the ED with complaints of back pain that started yesterday. Patient relays that she was at work and lifted a heavy box while twisting which resulted in pain to the bilateral lower back R>L. She states pain has been constant since onset, currently a 7/10, worse with movement, no relief with ibuprofen tried yesterday. Denies numbness, tingling, weakness, saddle anesthesia, incontinence to bowel/bladder, fever, chills, IV drug use, or hx of cancer. No prior back surgeries. Patient denies chance of pregnancy.   HPI  History reviewed. No pertinent past medical history.  There are no active problems to display for this patient.   History reviewed. No pertinent surgical history.   OB History   None      Home Medications    Prior to Admission medications   Medication Sig Start Date End Date Taking? Authorizing Provider  metroNIDAZOLE (FLAGYL) 500 MG tablet Take 1 tablet (500 mg total) by mouth 2 (two) times daily. Patient not taking: Reported on 07/20/2015 07/03/15   Lavera Guise, MD  omeprazole (PRILOSEC) 20 MG capsule Take 1 capsule (20 mg total) by mouth daily. 10/18/15   Gerhard Munch, MD  ondansetron (ZOFRAN) 4 MG tablet Take 1 tablet (4 mg total) by mouth every 8 (eight) hours as needed for nausea or vomiting. 11/09/16   Trixie Dredge, PA-C  Vaginal Lubricant (REPHRESH) GEL Place 1 application vaginally at bedtime as needed (vaginal irritation). Patient not taking: Reported on 10/18/2015 07/20/15   Deborha Payment, PA-C    Family History No family history on file.  Social History Social History   Tobacco Use  . Smoking status: Current Some Day Smoker    Packs/day: 1.00    Types: Cigarettes    . Smokeless tobacco: Never Used  Substance Use Topics  . Alcohol use: Yes    Comment: occasionally  . Drug use: No    Allergies   Patient has no known allergies.   Review of Systems Review of Systems  Constitutional: Negative for chills and fever.  Genitourinary: Negative for dysuria.  Musculoskeletal: Positive for back pain.  Neurological: Negative for weakness and numbness.       Negative for incontinence or saddle ansethesia.      Physical Exam Updated Vital Signs BP (!) 141/86   Pulse 76   Temp 98.3 F (36.8 C) (Oral)   Resp 16   Ht 5\' 3"  (1.6 m)   Wt 90.2 kg   LMP 12/25/2017 (Approximate)   SpO2 98%   BMI 35.22 kg/m   Physical Exam  Constitutional: She appears well-developed and well-nourished. No distress.  HENT:  Head: Normocephalic and atraumatic.  Eyes: Conjunctivae are normal. Right eye exhibits no discharge. Left eye exhibits no discharge.  Musculoskeletal:  Back: No obvious deformity, appreciable swelling, erythema, ecchymosis, open wounds, or increased warmth. Patient without point/focal midline tenderness or palpable step off. There is diffuse lumbar tenderness, R paraspinal>L.   Neurological: She is alert.  Clear speech. Sensation grossly intact to bilateral lower extremities. 5/5 strength with plantar/dorsiflexion bilaterally. Patellar DTRs are 2+ and symmetric. Patient is ambulatory.   Skin: Skin is warm and dry.  Psychiatric: She has a normal mood and affect. Her  behavior is normal. Thought content normal.  Nursing note and vitals reviewed.    ED Treatments / Results  Labs (all labs ordered are listed, but only abnormal results are displayed) Labs Reviewed - No data to display  EKG None  Radiology No results found.  Procedures Procedures (including critical care time)  Medications Ordered in ED Medications - No data to display   Initial Impression / Assessment and Plan / ED Course  I have reviewed the triage vital signs and the  nursing notes.  Pertinent labs & imaging results that were available during my care of the patient were reviewed by me and considered in my medical decision making (see chart for details).    Patient presents with complaint of back pain after heavy lifting.  Patient is nontoxic appearing, vitals are WNL other than elevated BP- doubt HTN emergency. Patient has normal neurologic exam, no point/focal midline tenderness to palpation. He is ambulatory in the ED.  No back pain red flags. No urinary sxs. Most likely muscle strain versus spasm. Considered disc disease, UTI/pyelonephritis, kidney stone, aortic aneurysm/dissection, cauda equina or epidural abscess however these do not fit clinical picture at this time. Will treat with Naproxen and Robaxin, discussed with patient that they are not to drive or operate heavy machinery while taking Robaxin. I discussed treatment plan, need for PCP follow-up, and return precautions with the patient. Provided opportunity for questions, patient confirmed understanding and is in agreement with plan.   Final Clinical Impressions(s) / ED Diagnoses   Final diagnoses:  Acute bilateral low back pain without sciatica    ED Discharge Orders         Ordered    naproxen (NAPROSYN) 500 MG tablet  2 times daily     01/19/18 0707    methocarbamol (ROBAXIN) 500 MG tablet  Every 8 hours PRN     01/19/18 0707           Cherly Anderson, PA-C 01/19/18 0709    Melene Plan, DO 01/19/18 [phone]

## 2018-10-02 ENCOUNTER — Encounter (HOSPITAL_COMMUNITY): Payer: Self-pay

## 2018-10-02 ENCOUNTER — Ambulatory Visit (HOSPITAL_COMMUNITY)
Admission: EM | Admit: 2018-10-02 | Discharge: 2018-10-02 | Disposition: A | Discharge status: Home / Self Care | Payer: BC Managed Care – PPO | Attending: Emergency Medicine | Admitting: Emergency Medicine

## 2018-10-02 ENCOUNTER — Other Ambulatory Visit: Payer: Self-pay

## 2018-10-02 DIAGNOSIS — L02412 Cutaneous abscess of left axilla: Secondary | ICD-10-CM | POA: Diagnosis present

## 2018-10-02 MED ORDER — LIDOCAINE-EPINEPHRINE (PF) 2 %-1:200000 IJ SOLN
INTRAMUSCULAR | Status: AC
  Filled 2018-10-02: qty 20

## 2018-10-02 MED ORDER — HYDROCODONE-ACETAMINOPHEN 5-325 MG PO TABS: 1.0000 | ORAL_TABLET | Freq: Four times a day (QID) | ORAL | 0 refills | Status: DC | PRN

## 2018-10-02 MED ORDER — DOXYCYCLINE HYCLATE 100 MG PO CAPS: 100.0000 mg | ORAL_CAPSULE | Freq: Two times a day (BID) | ORAL | 0 refills | Status: AC

## 2018-10-02 MED ORDER — IBUPROFEN 600 MG PO TABS: 600.0000 mg | ORAL_TABLET | Freq: Four times a day (QID) | ORAL | 0 refills | Status: DC | PRN

## 2018-10-02 NOTE — Discharge Instructions (Addendum)
Return here or follow up with your doctor in 2 days for a wound check If you are not getting significantly better give [place] a working phone number so that we contact you if we need to change your antibiotics. Take the medication as written. Take 1 gram of tylenol with the motrin up to 3-4 times a day as needed for pain and fever. This is an effective combination for pain. Take the hydrocodone/norco only for severe pain. Do not take the tylenol and hydrocodone/norco as they both have tylenol in them and too much can hurt your liver. Return to the ER if you get worse, have a persistent fever >100.4, or for any concerns.   Go to www.goodrx.com to look up your medications. This will give you a list of where you can find your prescriptions at the most affordable prices. Or ask the pharmacist what the cash price is, or if they have any other discount programs available to help make your medication more affordable. This can be less expensive than what you would pay with insurance.

## 2018-10-02 NOTE — ED Triage Notes (Signed)
Pt states she shave underneath her left armpit. Pt states she has a boil. X 3 days.

## 2018-10-02 NOTE — ED Provider Notes (Signed)
HPI  SUBJECTIVE:  Felicia Edwards is a 28 y.o. female who presents with painful mass of gradually increasing size in her left axilla starting after shaving starting several days ago.  Reports sharp, throbbing, constant pain.  No fevers, body aches, vomiting, lesions elsewhere.  No drainage.  She has had similar issues before, but usually states that they rupture and drain on their own.  She has tried warm compresses, boil ease, antibiotic cream without improvement in her symptoms.  Symptoms worse with wearing clothing, palpation.  No contacts with MRSA.  Past medical history negative for MRSA, diabetes, hiradenitis suppurativa, artificial heart valves or joints.  LMP: Now.  Denies the possibility being pregnant.  PMD: Dr. Ellyn HackBovard     History reviewed. No pertinent past medical history.  History reviewed. No pertinent surgical history.  History reviewed. No pertinent family history.  Social History   Tobacco Use  . Smoking status: Current Some Day Smoker    Packs/day: 1.00    Types: Cigarettes  . Smokeless tobacco: Never Used  Substance Use Topics  . Alcohol use: Yes    Comment: occasionally  . Drug use: No    No current facility-administered medications for this encounter.   Current Outpatient Medications:  .  doxycycline (VIBRAMYCIN) 100 MG capsule, Take 1 capsule (100 mg total) by mouth 2 (two) times daily for 5 days., Disp: 10 capsule, Rfl: 0 .  HYDROcodone-acetaminophen (NORCO/VICODIN) 5-325 MG tablet, Take 1-2 tablets by mouth every 6 (six) hours as needed for moderate pain or severe pain., Disp: 10 tablet, Rfl: 0 .  ibuprofen (ADVIL) 600 MG tablet, Take 1 tablet (600 mg total) by mouth every 6 (six) hours as needed., Disp: 30 tablet, Rfl: 0 .  omeprazole (PRILOSEC) 20 MG capsule, Take 1 capsule (20 mg total) by mouth daily., Disp: 21 capsule, Rfl: 0  No Known Allergies   ROS  As noted in HPI.   Physical Exam  BP 114/68 (BP Location: Right Arm)   Pulse 80   Temp  98.7 F (37.1 C) (Oral)   Resp 16   Wt 86.2 kg   LMP 10/02/2018   SpO2 100%   BMI 33.66 kg/m   Constitutional: Well developed, well nourished, no acute distress Eyes:  EOMI, conjunctiva normal bilaterally HENT: Normocephalic, atraumatic,mucus membranes moist Respiratory: Normal inspiratory effort Cardiovascular: Normal rate GI: nondistended skin: 4 x 1-1/2 cm tender area of induration with central fluctuance left axilla.  See picture.    Musculoskeletal: no deformities Neurologic: Alert & oriented x 3, no focal neuro deficits Psychiatric: Speech and behavior appropriate   ED Course   Medications  lidocaine-EPINEPHrine (XYLOCAINE W/EPI) 2 %-1:200000 (PF) injection (has no administration in time range)    Orders Placed This Encounter  Procedures  . Aerobic Culture (superficial specimen)    Standing Status:   Standing    Number of Occurrences:   1    Order Specific Question:   Patient immune status    Answer:   Normal    No results found for this or any previous visit (from the past 24 hour(s)). No results found.  ED Clinical Impression  1. Abscess of axilla, left      ED Assessment/Plan  [place] Narcotic database reviewed for this patient, and feel that the risk/benefit ratio today is favorable for proceeding with a prescription for controlled substance.  No opiate prescriptions recently.  Procedure note: Cleaned area with chlorhexidine and alcohol.  Performed local anesthesia via infiltration with 1.5 cc of lidocaine 2%  with epinephrine with adequate anesthesia.  Made a single stab incision with an 11 blade.  Expressed a copious amount of pus.  Explored wound to break up loculations.  Cultures sent.  Attempted to fully express all pus, then attempted to irrigate, discontinued procedure due to patient discomfort.  Did not attempt to pack.  Dressing placed.  Home with doxycycline, Tylenol/ibuprofen, Norco/ibuprofen for severe pain.  Return here as needed.  Or follow-up  with PMD as needed.  Discussed MDM, treatment plan, and plan for follow-up with patient. . patient agrees with plan.   Meds ordered this encounter  Medications  . HYDROcodone-acetaminophen (NORCO/VICODIN) 5-325 MG tablet    Sig: Take 1-2 tablets by mouth every 6 (six) hours as needed for moderate pain or severe pain.    Dispense:  10 tablet    Refill:  0  . ibuprofen (ADVIL) 600 MG tablet    Sig: Take 1 tablet (600 mg total) by mouth every 6 (six) hours as needed.    Dispense:  30 tablet    Refill:  0  . doxycycline (VIBRAMYCIN) 100 MG capsule    Sig: Take 1 capsule (100 mg total) by mouth 2 (two) times daily for 5 days.    Dispense:  10 capsule    Refill:  0    *This clinic note was created using [employer]. Therefore, there may be occasional mistakes despite careful proofreading.   ?    Melynda Ripple, MD 10/02/18 1141

## 2018-10-04 LAB — AEROBIC CULTURE W GRAM STAIN (SUPERFICIAL SPECIMEN): Special Requests: NORMAL

## 2018-10-06 ENCOUNTER — Telehealth (HOSPITAL_COMMUNITY): Payer: Self-pay | Admitting: Emergency Medicine

## 2018-10-06 NOTE — Telephone Encounter (Signed)
Treated with Doxy per Dr. Lanny Cramp, adequate treatment. Attempted to reach patient. No answer at this time.

## 2019-04-21 ENCOUNTER — Ambulatory Visit (HOSPITAL_COMMUNITY)
Admission: EM | Admit: 2019-04-21 | Discharge: 2019-04-21 | Disposition: A | Discharge status: Home / Self Care | Payer: BC Managed Care – PPO | Attending: Family Medicine | Admitting: Family Medicine

## 2019-04-21 ENCOUNTER — Other Ambulatory Visit: Payer: Self-pay

## 2019-04-21 ENCOUNTER — Encounter (HOSPITAL_COMMUNITY): Payer: Self-pay

## 2019-04-21 DIAGNOSIS — L03012 Cellulitis of left finger: Secondary | ICD-10-CM | POA: Diagnosis not present

## 2019-04-21 MED ORDER — CEPHALEXIN 500 MG PO CAPS: 500.0000 mg | ORAL_CAPSULE | Freq: Four times a day (QID) | ORAL | 0 refills | Status: AC

## 2019-04-21 NOTE — Discharge Instructions (Signed)
Take the medication as prescribed Warm soaks and massage the finger.  Follow up as needed for continued or worsening symptoms

## 2019-04-21 NOTE — ED Provider Notes (Signed)
[facility]    CSN: [id number] Arrival date & time: 04/21/19  0809      History   Chief Complaint Chief Complaint  Patient presents with  . Nail Problem    HPI Felicia Edwards is a 29 y.o. female.   Pt is a 29 year old female that presents with swelling, draining and erythema to the left ringer finger. Located to nailbed.  Started after she pulled a hangnail.  Reports draining green discharge.  Mild pain. Some swelling to the pad of finger. She has been doing warm soaks and massaging the finger.  No fever, chills, body aches. No injury to the finger.   ROS per HPI       History reviewed. No pertinent past medical history.  There are no problems to display for this patient.   History reviewed. No pertinent surgical history.  OB History   No obstetric history on file.      Home Medications    Prior to Admission medications   Medication Sig Start Date End Date Taking? Authorizing Provider  cephALEXin (KEFLEX) 500 MG capsule Take 1 capsule (500 mg total) by mouth 4 (four) times daily for 5 days. 04/21/19 04/26/19  Dahlia Byes A, NP  HYDROcodone-acetaminophen (NORCO/VICODIN) 5-325 MG tablet Take 1-2 tablets by mouth every 6 (six) hours as needed for moderate pain or severe pain. 10/02/18   Domenick Gong, MD  ibuprofen (ADVIL) 600 MG tablet Take 1 tablet (600 mg total) by mouth every 6 (six) hours as needed. 10/02/18   Domenick Gong, MD  omeprazole (PRILOSEC) 20 MG capsule Take 1 capsule (20 mg total) by mouth daily. 10/18/15   Gerhard Munch, MD    Family History History reviewed. No pertinent family history.  Social History Social History   Tobacco Use  . Smoking status: Current Some Day Smoker    Packs/day: 1.00    Types: Cigarettes  . Smokeless tobacco: Never Used  Substance Use Topics  . Alcohol use: Yes    Comment: occasionally  . Drug use: No     Allergies   Patient has no known allergies.   Review of Systems Review of  Systems   Physical Exam Triage Vital Signs ED Triage Vitals  Enc Vitals Group     BP 04/21/19 0839 114/69     Pulse Rate 04/21/19 0839 76     Resp 04/21/19 0839 16     Temp 04/21/19 0839 98.5 F (36.9 C)     Temp Source 04/21/19 0839 Oral     SpO2 04/21/19 0839 100 %     Weight 04/21/19 0838 175 lb (79.4 kg)     Height --      Head Circumference --      Peak Flow --      Pain Score 04/21/19 0838 6     Pain Loc --      Pain Edu? --      Excl. in [place]? --    No data found.  Updated Vital Signs BP 114/69 (BP Location: Right Arm)   Pulse 76   Temp 98.5 F (36.9 C) (Oral)   Resp 16   Wt 175 lb (79.4 kg)   LMP 04/14/2019   SpO2 100%   BMI 31.00 kg/m   Visual Acuity Right Eye Distance:   Left Eye Distance:   Bilateral Distance:    Right Eye Near:   Left Eye Near:    Bilateral Near:     Physical Exam Vitals and nursing  note reviewed.  Constitutional:      General: She is not in acute distress.    Appearance: Normal appearance. She is not ill-appearing, toxic-appearing or diaphoretic.  HENT:     Head: Normocephalic.     Nose: Nose normal.  Eyes:     Conjunctiva/sclera: Conjunctivae normal.  Pulmonary:     Effort: Pulmonary effort is normal.  Musculoskeletal:        General: Normal range of motion.     Cervical back: Normal range of motion.  Skin:    General: Skin is warm and dry.     Findings: No rash.     Comments: Paronychia to left ring finger   Neurological:     Mental Status: She is alert.  Psychiatric:        Mood and Affect: Mood normal.      UC Treatments / Results  Labs (all labs ordered are listed, but only abnormal results are displayed) Labs Reviewed - No data to display  EKG   Radiology No results found.  Procedures Procedures (including critical care time)  Medications Ordered in UC Medications - No data to display  Initial Impression / Assessment and Plan / UC Course  I have reviewed the triage vital signs and the nursing  notes.  Pertinent labs & imaging results that were available during my care of the patient were reviewed by me and considered in my medical decision making (see chart for details).     Paronychia of left ring finger-area is already draining and there is no indication for I&D today. We will have her continue the warm soaks, massaging the finger and placed on Keflex. Follow up as needed for continued or worsening symptoms  Final Clinical Impressions(s) / UC Diagnoses   Final diagnoses:  Paronychia of finger of left hand     Discharge Instructions     Take the medication as prescribed Warm soaks and massage the finger.  Follow up as needed for continued or worsening symptoms     ED Prescriptions    Medication Sig Dispense Auth. Provider   cephALEXin (KEFLEX) 500 MG capsule Take 1 capsule (500 mg total) by mouth 4 (four) times daily for 5 days. 20 capsule Loura Halt A, NP     PDMP not reviewed this encounter.   Loura Halt A, NP 04/21/19 [phone]

## 2019-04-21 NOTE — ED Triage Notes (Signed)
Pt state she has a infected nail bed on her left hand ring finger x 5 days. Pt states it swollen and draining.

## 2019-06-04 ENCOUNTER — Encounter (HOSPITAL_COMMUNITY): Payer: Self-pay

## 2019-06-04 ENCOUNTER — Other Ambulatory Visit: Payer: Self-pay

## 2019-06-04 ENCOUNTER — Ambulatory Visit (HOSPITAL_COMMUNITY)
Admission: EM | Admit: 2019-06-04 | Discharge: 2019-06-04 | Disposition: A | Discharge status: Home / Self Care | Payer: Self-pay | Attending: Family Medicine | Admitting: Family Medicine

## 2019-06-04 ENCOUNTER — Ambulatory Visit (INDEPENDENT_AMBULATORY_CARE_PROVIDER_SITE_OTHER): Payer: Self-pay

## 2019-06-04 DIAGNOSIS — M79641 Pain in right hand: Secondary | ICD-10-CM

## 2019-06-04 DIAGNOSIS — M659 Synovitis and tenosynovitis, unspecified: Secondary | ICD-10-CM

## 2019-06-04 MED ORDER — HYDROCODONE-ACETAMINOPHEN 5-325 MG PO TABS: 2.0000 | ORAL_TABLET | ORAL | 0 refills | Status: DC | PRN

## 2019-06-04 MED ORDER — METHYLPREDNISOLONE 4 MG PO TBPK: ORAL_TABLET | ORAL | 0 refills | Status: DC

## 2019-06-04 NOTE — Discharge Instructions (Signed)
Take the medrol pak as directed Take all of day one today Limit use of hand Use ice to reduce pain and swelling Take the pain medicine as needed Do not drive on the pain medicine After the medrol you may take aleve 2 pills morning and night

## 2019-06-04 NOTE — ED Provider Notes (Signed)
[facility]    CSN: [id number] Arrival date & time: 06/04/19  0800      History   Chief Complaint Chief Complaint  Patient presents with  . Hand Injury    HPI Felicia Edwards is a 29 y.o. female.   HPI  Patient is here for hand pain.  She states that her pain started about a week ago.  She states she uses her hands extensively.  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  She states her right hand is swollen.  Painful.  Pain with movement of the fingers.  Pain with grasp.  She is having difficulty with her clothing and hygiene.  She sometimes has numbness.  The pain awakens her at night.  She states that she was unable to sleep well last night because of the pain.  She has never had this before. Patient is not diabetic Patient is not pregnant  History reviewed. No pertinent past medical history.  There are no problems to display for this patient.   History reviewed. No pertinent surgical history.  OB History   No obstetric history on file.      Home Medications    Prior to Admission medications   Medication Sig Start Date End Date Taking? Authorizing Provider  HYDROcodone-acetaminophen (NORCO/VICODIN) 5-325 MG tablet Take 2 tablets by mouth every 4 (four) hours as needed. 06/04/19   Raylene Everts, MD  methylPREDNISolone (MEDROL DOSEPAK) 4 MG TBPK tablet TAD 06/04/19   Raylene Everts, MD  omeprazole (PRILOSEC) 20 MG capsule Take 1 capsule (20 mg total) by mouth daily. 10/18/15 06/04/19  Carmin Muskrat, MD    Family History Family History  Problem Relation Age of Onset  . Diabetes Father     Social History Social History   Tobacco Use  . Smoking status: Current Some Day Smoker    Packs/day: 1.00    Types: Cigarettes  . Smokeless tobacco: Never Used  Substance Use Topics  . Alcohol use: Yes    Comment: occasionally  . Drug use: No     Allergies   Patient has no known allergies.   Review of Systems Review  of Systems  Musculoskeletal: Positive for arthralgias.       Swelling and pain in right hand     Physical Exam Triage Vital Signs ED Triage Vitals [06/04/19 0816]  Enc Vitals Group     BP 119/78     Pulse Rate 81     Resp 16     Temp 98.3 F (36.8 C)     Temp Source Oral     SpO2 98 %     Weight 182 lb (82.6 kg)     Height 5\' 2"  (1.575 m)     Head Circumference      Peak Flow      Pain Score 9     Pain Loc      Pain Edu?      Excl. in [place]?    No data found.  Updated Vital Signs BP 119/78   Pulse 81   Temp 98.3 F (36.8 C) (Oral)   Resp 16   Ht 5\' 2"  (1.575 m)   Wt 82.6 kg   SpO2 98%   BMI 33.29 kg/m   Visual Acuity Right Eye Distance:   Left Eye Distance:   Bilateral Distance:    Right Eye Near:   Left Eye Near:    Bilateral Near:     Physical  Exam Constitutional:      General: She is not in acute distress.    Appearance: She is well-developed and normal weight.     Comments: Overweight.  Uncomfortable  HENT:     Head: Normocephalic and atraumatic.     Mouth/Throat:     Comments: Mask in place Eyes:     Conjunctiva/sclera: Conjunctivae normal.     Pupils: Pupils are equal, round, and reactive to light.  Cardiovascular:     Rate and Rhythm: Normal rate.  Pulmonary:     Effort: Pulmonary effort is normal. No respiratory distress.  Musculoskeletal:        General: Normal range of motion.     Cervical back: Normal range of motion.     Comments: Right hand has soft tissue swelling across the dorsum.  Pain on the extensor tendons.  Pain with any movement of the fingers.  Phalen's and Tinel's negative.  Grip is very weak.  Sensory exam is normal.  Palmar exam is normal  Skin:    General: Skin is warm and dry.  Neurological:     General: No focal deficit present.     Mental Status: She is alert.  Psychiatric:        Mood and Affect: Mood normal.        Behavior: Behavior normal.      UC Treatments / Results  Labs (all labs ordered are listed,  but only abnormal results are displayed) Labs Reviewed - No data to display  EKG   Radiology DG Hand Complete Right  Result Date: 06/04/2019 CLINICAL DATA:  Right hand pain for 1 week EXAM: RIGHT HAND - COMPLETE 3+ VIEW COMPARISON:  None. FINDINGS: There is no evidence of fracture or dislocation. There is no evidence of arthropathy or other focal bone abnormality. Soft tissues are unremarkable. IMPRESSION: Negative. Electronically Signed   By: Sherian Rein M.D.   On: 06/04/2019 08:31    Procedures Procedures (including critical care time)  Medications Ordered in UC Medications - No data to display  Initial Impression / Assessment and Plan / UC Course  I have reviewed the triage vital signs and the nursing notes.  Pertinent labs & imaging results that were available during my care of the patient were reviewed by me and considered in my medical decision making (see chart for details).     I explained to the patient has tenosynovitis of her right hand from overuse.  Reviewed that treatment would include rest, ice, compression, anti-inflammatory medication. Final Clinical Impressions(s) / UC Diagnoses   Final diagnoses:  Tenosynovitis of right hand     Discharge Instructions     Take the medrol pak as directed Take all of day one today Limit use of hand Use ice to reduce pain and swelling Take the pain medicine as needed Do not drive on the pain medicine After the medrol you may take aleve 2 pills morning and night   ED Prescriptions    Medication Sig Dispense Auth. Provider   methylPREDNISolone (MEDROL DOSEPAK) 4 MG TBPK tablet TAD 21 tablet Eustace Moore, MD   HYDROcodone-acetaminophen (NORCO/VICODIN) 5-325 MG tablet Take 2 tablets by mouth every 4 (four) hours as needed. 10 tablet Eustace Moore, MD     I have reviewed the PDMP during this encounter.   Eustace Moore, MD 06/04/19 2109

## 2019-06-04 NOTE — ED Triage Notes (Signed)
Pt c/o right hand pain that started 1 wk ago. 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 She states it constantly aches now and has been swelling up. Pt has 2+ edema of right hand, 2+ right radial pulse, hand warm to touch, 3/5 right hand grip.

## 2021-03-19 ENCOUNTER — Emergency Department (HOSPITAL_COMMUNITY)
Admission: EM | Admit: 2021-03-19 | Discharge: 2021-03-19 | Disposition: A | Discharge status: Home / Self Care | Payer: Medicaid Other | Attending: Emergency Medicine | Admitting: Emergency Medicine

## 2021-03-19 ENCOUNTER — Other Ambulatory Visit: Payer: Self-pay

## 2021-03-19 ENCOUNTER — Encounter (HOSPITAL_COMMUNITY): Payer: Self-pay | Admitting: Emergency Medicine

## 2021-03-19 ENCOUNTER — Emergency Department (HOSPITAL_COMMUNITY): Payer: Medicaid Other

## 2021-03-19 DIAGNOSIS — R519 Headache, unspecified: Secondary | ICD-10-CM | POA: Diagnosis not present

## 2021-03-19 LAB — BASIC METABOLIC PANEL
Anion gap: 5 (ref 5–15)
BUN: 13 mg/dL (ref 6–20)
CO2: 25 mmol/L (ref 22–32)
Calcium: 8.8 mg/dL — ABNORMAL LOW (ref 8.9–10.3)
Chloride: 107 mmol/L (ref 98–111)
Creatinine, Ser: 0.85 mg/dL (ref 0.44–1.00)
GFR, Estimated: >60 mL/min (ref >60)
Glucose, Bld: 97 mg/dL (ref 70–99)
Potassium: 4.3 mmol/L (ref 3.5–5.1)
Sodium: 137 mmol/L (ref 135–145)

## 2021-03-19 LAB — HCG, QUANTITATIVE, PREGNANCY: hCG, Beta Chain, Quant, S: <1 m[IU]/mL (ref <5)

## 2021-03-19 LAB — CBC WITH DIFFERENTIAL/PLATELET
Abs Immature Granulocytes: 0.01 10*3/uL (ref 0.00–0.07)
Basophils Absolute: 0 10*3/uL (ref 0.0–0.1)
Basophils Relative: 1 %
Eosinophils Absolute: 0.1 10*3/uL (ref 0.0–0.5)
Eosinophils Relative: 2 %
HCT: 42.7 % (ref 36.0–46.0)
Hemoglobin: 14.2 g/dL (ref 12.0–15.0)
Immature Granulocytes: 0 %
Lymphocytes Relative: 43 %
Lymphs Abs: 1.4 10*3/uL (ref 0.7–4.0)
MCH: 31.5 pg (ref 26.0–34.0)
MCHC: 33.3 g/dL (ref 30.0–36.0)
MCV: 94.7 fL (ref 80.0–100.0)
Monocytes Absolute: 0.3 10*3/uL (ref 0.1–1.0)
Monocytes Relative: 10 %
Neutro Abs: 1.4 10*3/uL — ABNORMAL LOW (ref 1.7–7.7)
Neutrophils Relative %: 44 %
Platelets: 241 10*3/uL (ref 150–400)
RBC: 4.51 MIL/uL (ref 3.87–5.11)
RDW: 12.6 % (ref 11.5–15.5)
WBC: 3.2 10*3/uL — ABNORMAL LOW (ref 4.0–10.5)
nRBC: 0 % (ref 0.0–0.2)

## 2021-03-19 MED ORDER — SODIUM CHLORIDE 0.9 % IV BOLUS
1000.0000 mL | Freq: Once | INTRAVENOUS | Status: AC
  Administered 2021-03-19: 1000 mL via INTRAVENOUS

## 2021-03-19 MED ORDER — KETOROLAC TROMETHAMINE 30 MG/ML IJ SOLN
30.0000 mg | Freq: Once | INTRAMUSCULAR | Status: AC
  Administered 2021-03-19: 30 mg via INTRAVENOUS
  Filled 2021-03-19: qty 1

## 2021-03-19 NOTE — ED Triage Notes (Signed)
Patient c/o headaches over the past 4 days. Pain to left side of head. Denies any nausea or emesis. No light sensitivity. Minor relief with advil.

## 2021-03-19 NOTE — ED Provider Notes (Signed)
[facility]-EMERGENCY DEPT Provider Note   CSN: [id number] Arrival date & time: 03/19/21  1517     History  Chief Complaint  Patient presents with   Headache    Felicia Edwards is a 31 y.o. female.  With no past medical history who presents to the emergency department with headache.  She states that for the past 4 days she has had a left-sided temporal and parietal headache that she describes as throbbing.  She states that it has been intermittent.  She states that she has tried Advil that provides mild relief of symptoms briefly and then return of headache.  She notes that this morning she woke up out of sleep with throbbing headache.  She states that she has had no associated nausea, vomiting, photophobia or phonophobia.  Denies numbness or tingling in her arms or legs or face.  Denies weakness of her extremities.  She denies head trauma.  Denies fevers, visual changes, neck stiffness or neck pain.  She does not have a history of headaches.   Headache Associated symptoms: no dizziness, no fever, no nausea, no photophobia, no seizures, no vomiting and no weakness       Home Medications Prior to Admission medications   Medication Sig Start Date End Date Taking? Authorizing Provider  HYDROcodone-acetaminophen (NORCO/VICODIN) 5-325 MG tablet Take 2 tablets by mouth every 4 (four) hours as needed. 06/04/19   Eustace MooreNelson, Yvonne Sue, MD  methylPREDNISolone (MEDROL DOSEPAK) 4 MG TBPK tablet TAD 06/04/19   Eustace MooreNelson, Yvonne Sue, MD  omeprazole (PRILOSEC) 20 MG capsule Take 1 capsule (20 mg total) by mouth daily. 10/18/15 06/04/19  Gerhard MunchLockwood, Robert, MD      Allergies    Patient has no known allergies.    Review of Systems   Review of Systems  Constitutional:  Negative for fever.  Eyes:  Negative for photophobia and visual disturbance.  Gastrointestinal:  Negative for nausea and vomiting.  Neurological:  Positive for headaches. Negative for dizziness, tremors, seizures, weakness  and light-headedness.  All other systems reviewed and are negative.  Physical Exam Updated Vital Signs BP (!) 157/94 (BP Location: Right Arm)    Pulse 97    Temp 98.1 F (36.7 C) (Oral)    Resp 18    Ht 5\' 2"  (1.575 m)    Wt 81.6 kg    SpO2 98%    BMI 32.92 kg/m  Physical Exam Vitals and nursing note reviewed.  Constitutional:      General: She is not in acute distress.    Appearance: Normal appearance. She is well-developed. She is not ill-appearing or toxic-appearing.  HENT:     Head: Normocephalic and atraumatic.     Nose: Nose normal.     Mouth/Throat:     Mouth: Mucous membranes are moist.     Pharynx: Oropharynx is clear.  Eyes:     General: No visual field deficit or scleral icterus.    Extraocular Movements: Extraocular movements intact.     Right eye: Normal extraocular motion and no nystagmus.     Left eye: Normal extraocular motion and no nystagmus.     Pupils: Pupils are equal, round, and reactive to light.  Neck:     Meningeal: Brudzinski's sign absent.  Cardiovascular:     Rate and Rhythm: Normal rate and regular rhythm.     Heart sounds: Normal heart sounds. No murmur heard. Pulmonary:     Effort: Pulmonary effort is normal. No respiratory distress.     Breath  sounds: Normal breath sounds.  Abdominal:     General: Bowel sounds are normal. There is no distension.     Palpations: Abdomen is soft.  Musculoskeletal:        General: Normal range of motion.     Cervical back: Normal range of motion and neck supple. No rigidity.  Skin:    General: Skin is warm and dry.     Capillary Refill: Capillary refill takes less than 2 seconds.  Neurological:     General: No focal deficit present.     Mental Status: She is alert and oriented to person, place, and time. Mental status is at baseline.     GCS: GCS eye subscore is 4. GCS verbal subscore is 5. GCS motor subscore is 6.     Cranial Nerves: No cranial nerve deficit, dysarthria or facial asymmetry.     Sensory: No  sensory deficit.     Motor: No weakness.     Gait: Gait normal.  Psychiatric:        Mood and Affect: Mood normal.        Speech: Speech normal.        Behavior: Behavior normal.    ED Results / Procedures / Treatments   Labs (all labs ordered are listed, but only abnormal results are displayed) Labs Reviewed  BASIC METABOLIC PANEL - Abnormal; Notable for the following components:      Result Value   Calcium 8.8 (*)    All other components within normal limits  CBC WITH DIFFERENTIAL/PLATELET - Abnormal; Notable for the following components:   WBC 3.2 (*)    Neutro Abs 1.4 (*)    All other components within normal limits  HCG, QUANTITATIVE, PREGNANCY   EKG None  Radiology No results found. CT head without abnormalities - See results review   Procedures Procedures    Medications Ordered in ED Medications  sodium chloride 0.9 % bolus 1,000 mL (0 mLs Intravenous Stopped 03/19/21 1659)  ketorolac (TORADOL) 30 MG/ML injection 30 mg (30 mg Intravenous Given 03/19/21 1657)    ED Course/ Medical Decision Making/ A&P                           Medical Decision Making Patient presents with a headache most consistent with benign headache from either tension type headache versus migraine.  No headache red flags.  Neurological exam without evidence of meningismus, altered mental status, focal neurological findings of doubt meningitis, encephalitis, stroke. Presentations not consistent with acute intracranial bleed to include SAH (lack of risk factors, headache history).  No history of trauma so doubt ICH.  Given history and physical exam, temporal arteritis is unlikely, as is acute angle-closure glaucoma.  Doubt carotid artery dissection given no focal neurological deficits, no neck trauma or recent neck strain.  Patient with no signs of increased intracranial pressure or weight loss and history of physical exam suggestive for benign headache so less likely mass-effect and brain from tumor  or abscess or idiopathic intracranial hypertension. Labs within normal limits-I have independently reviewed the laboratory findings. I have independently reviewed the CT head which is negative for acute intracranial findings.  I agree with the radiologist review of imaging. No indication for consultation at this time. Patient given headache cocktail here in the emergency department with fluids and ketorolac with improvement in symptoms. Patient most appropriate for discharge home with primary care follow-up.  Vital signs are stable.  Given return precautions for  worsening headache or focal neurological findings.  Patient verbalized understanding.  Safe for discharge.  Final Clinical Impression(s) / ED Diagnoses Final diagnoses:  Acute nonintractable headache, unspecified headache type    Rx / DC Orders ED Discharge Orders     None         Cristopher Peru, PA-C 03/21/21 2122    Gloris Manchester, MD 03/22/21 [phone]

## 2021-03-19 NOTE — Discharge Instructions (Signed)
You were seen in the emergency department today for headache.  While you were here we were able to get your headache under control.  Sometimes headaches can be related to stress, blood pressure or other causes.  Your blood pressure was a little elevated here in the emergency department.  Please establish as a primary care patient here in [place] where you have just moved to.  If you are unable to establish in a primary care clinic I provided you with information for [facility] for you to be seen there and have routine primary care follow-up.  Please return to the emergency department if you have worsening headache associated with nausea or vomiting, fevers, confusion or weakness of your extremities.

## 2021-07-02 ENCOUNTER — Emergency Department (HOSPITAL_COMMUNITY)
Admission: EM | Admit: 2021-07-02 | Discharge: 2021-07-02 | Disposition: A | Discharge status: Home / Self Care | Payer: Medicaid Other | Attending: Emergency Medicine | Admitting: Emergency Medicine

## 2021-07-02 ENCOUNTER — Other Ambulatory Visit: Payer: Self-pay

## 2021-07-02 DIAGNOSIS — X58XXXA Exposure to other specified factors, initial encounter: Secondary | ICD-10-CM | POA: Insufficient documentation

## 2021-07-02 DIAGNOSIS — T192XXA Foreign body in vulva and vagina, initial encounter: Secondary | ICD-10-CM | POA: Diagnosis not present
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
## 2021-07-02 NOTE — ED Triage Notes (Signed)
Pt came in with tampon stuck in vagina since yesterday ?

## 2021-07-02 NOTE — ED Provider Notes (Signed)
?  [facility]-EMERGENCY DEPT ?Provider Note ? ? ?CSN: [id number] ?Arrival date & time: 07/02/21  1842 ? ?  ? ?History ? ?Chief Complaint  ?Patient presents with  ? Foreign Body in Vagina  ? ? ?Felicia Edwards is a 31 y.o. female. ? ? ?Foreign Body in Vagina ?Pertinent negatives include no abdominal pain. Patient is worried she may have a retained tampon in vagina.  States around 2 in the morning last night she thinks she may have put a tampon in, however she was drinking.  States then later that night had intercourse.  Now cannot find a tampon. ? ?  ? ?Home Medications ?Prior to Admission medications   ?Medication Sig Start Date End Date Taking? Authorizing Provider  ?HYDROcodone-acetaminophen (NORCO/VICODIN) 5-325 MG tablet Take 2 tablets by mouth every 4 (four) hours as needed. 06/04/19   Eustace Moore, MD  ?methylPREDNISolone (MEDROL DOSEPAK) 4 MG TBPK tablet TAD 06/04/19   Eustace Moore, MD  ?omeprazole (PRILOSEC) 20 MG capsule Take 1 capsule (20 mg total) by mouth daily. 10/18/15 06/04/19  Gerhard Munch, MD  ?   ? ?Allergies    ?Patient has no known allergies.   ? ?Review of Systems   ?Review of Systems  ?Gastrointestinal:  Negative for abdominal pain.  ?Genitourinary:  Negative for vaginal bleeding and vaginal discharge.  ? ?Physical Exam ?Updated Vital Signs ?BP (!) 142/98   Pulse 70   Temp 98 ?F (36.7 ?C) (Oral)   Resp 19   Ht 5\' 3"  (1.6 m)   Wt 91.2 kg   SpO2 100%   BMI 35.61 kg/m?  ?Physical Exam ?Vitals and nursing note reviewed.  ?Abdominal:  ?   Tenderness: There is no abdominal tenderness.  ?Genitourinary: ?   Comments: Pelvic exam showed retained tampon. Removed.  ?Musculoskeletal:  ?   Cervical back: Neck supple.  ?Neurological:  ?   Mental Status: She is alert.  ? ? ?ED Results / Procedures / Treatments   ?Labs ?(all labs ordered are listed, but only abnormal results are displayed) ?Labs Reviewed - No data to display ? ?EKG ?None ? ?Radiology ?No results  found. ? ?Procedures ?Procedures  ? ? ?Medications Ordered in ED ?Medications - No data to display ? ?ED Course/ Medical Decision Making/ A&P ?  ?                        ?Medical Decision Making ? ?Patient presents with possible retained tampon.  States she thought she placed 1 last night and then had intercourse.  Was not able to get out this morning.  Physical exam did show retained tampon.  Removed with a pair of ring forceps.  Had only been in since last night.  Does not appear to need further work-up.  Will discharge home ? ? ? ? ? ? ? ?Final Clinical Impression(s) / ED Diagnoses ?Final diagnoses:  ?Foreign body in vagina, initial encounter  ? ? ?Rx / DC Orders ?ED Discharge Orders   ? ? None  ? ?  ? ? ?  ? , MD ?07/02/21 2324 ? ?

## 2022-02-02 ENCOUNTER — Ambulatory Visit (HOSPITAL_COMMUNITY)
Admission: EM | Admit: 2022-02-02 | Discharge: 2022-02-02 | Disposition: A | Discharge status: Home / Self Care | Payer: BC Managed Care – PPO | Attending: Emergency Medicine | Admitting: Emergency Medicine

## 2022-02-02 ENCOUNTER — Encounter (HOSPITAL_COMMUNITY): Payer: Self-pay

## 2022-02-02 DIAGNOSIS — J069 Acute upper respiratory infection, unspecified: Secondary | ICD-10-CM

## 2022-02-02 LAB — POCT RAPID STREP A, ED / UC: Streptococcus, Group A Screen (Direct): NEGATIVE

## 2022-02-02 MED ORDER — IBUPROFEN 800 MG PO TABS: 800.0000 mg | ORAL_TABLET | Freq: Three times a day (TID) | ORAL | 0 refills | Status: DC

## 2022-02-02 MED ORDER — LIDOCAINE VISCOUS HCL 2 % MT SOLN: 15.0000 mL | OROMUCOSAL | 0 refills | Status: DC | PRN

## 2022-02-02 NOTE — ED Provider Notes (Signed)
[facility]    CSN: [id number] Arrival date & time: 02/02/22  0805      History   Chief Complaint Chief Complaint  Patient presents with   Sore Throat   Nasal Congestion   Cough    HPI Felicia Edwards is a 31 y.o. female.   Patient presents with mucus to the throat for 3 to 4 days, began to experience a sore throat, a productive cough, nasal congestion and rhinorrhea 2 days ago.  Left-sided ear pain began 1 day ago.  Symptoms worsened overnight interfering with sleep.  Has attempted use of NyQuil, herbal tea with honey without relief.  No known sick contacts.  Tolerating food and liquids.  No pertinent medical history.  Denies shortness of breath, wheezing left side ear pain   History reviewed. No pertinent past medical history.  There are no problems to display for this patient.   History reviewed. No pertinent surgical history.  OB History   No obstetric history on file.      Home Medications    Prior to Admission medications   Medication Sig Start Date End Date Taking? Authorizing Provider  HYDROcodone-acetaminophen (NORCO/VICODIN) 5-325 MG tablet Take 2 tablets by mouth every 4 (four) hours as needed. 06/04/19   Eustace Moore, MD  methylPREDNISolone (MEDROL DOSEPAK) 4 MG TBPK tablet TAD 06/04/19   Eustace Moore, MD  omeprazole (PRILOSEC) 20 MG capsule Take 1 capsule (20 mg total) by mouth daily. 10/18/15 06/04/19  Gerhard Munch, MD    Family History Family History  Problem Relation Age of Onset   Diabetes Father     Social History Social History   Tobacco Use   Smoking status: Some Days    Packs/day: 1.00    Types: Cigarettes   Smokeless tobacco: Never  Vaping Use   Vaping Use: Never used  Substance Use Topics   Alcohol use: Yes    Comment: occasionally   Drug use: No     Allergies   Patient has no known allergies.   Review of Systems Review of Systems  Constitutional: Negative.   HENT:  Positive for congestion, ear  pain, rhinorrhea and sore throat. Negative for dental problem, drooling, ear discharge, facial swelling, hearing loss, mouth sores, nosebleeds, postnasal drip, sinus pressure, sinus pain, sneezing, tinnitus, trouble swallowing and voice change.   Respiratory:  Positive for cough. Negative for apnea, choking, chest tightness, shortness of breath, wheezing and stridor.   Cardiovascular: Negative.   Skin: Negative.   Neurological: Negative.      Physical Exam Triage Vital Signs ED Triage Vitals [02/02/22 0836]  Enc Vitals Group     BP (!) 155/95     Pulse Rate 82     Resp 18     Temp 99.1 F (37.3 C)     Temp Source Oral     SpO2 100 %     Weight      Height      Head Circumference      Peak Flow      Pain Score      Pain Loc      Pain Edu?      Excl. in GC?    No data found.  Updated Vital Signs BP (!) 155/95 (BP Location: Left Arm)   Pulse 82   Temp 99.1 F (37.3 C) (Oral)   Resp 18   SpO2 100%   Visual Acuity Right Eye Distance:   Left Eye Distance:   Bilateral Distance:  Right Eye Near:   Left Eye Near:    Bilateral Near:     Physical Exam Constitutional:      Appearance: Normal appearance. She is well-developed.  HENT:     Head: Normocephalic.     Right Ear: Tympanic membrane and ear canal normal.     Left Ear: Tympanic membrane and ear canal normal.     Nose: Congestion and rhinorrhea present.     Mouth/Throat:     Mouth: Mucous membranes are moist.     Pharynx: No posterior oropharyngeal erythema.     Tonsils: No tonsillar exudate. 0 on the right. 0 on the left.  Eyes:     Extraocular Movements: Extraocular movements intact.  Cardiovascular:     Rate and Rhythm: Normal rate and regular rhythm.     Pulses: Normal pulses.     Heart sounds: Normal heart sounds.  Pulmonary:     Effort: Pulmonary effort is normal.     Breath sounds: Normal breath sounds.  Musculoskeletal:     Cervical back: Normal range of motion.  Lymphadenopathy:     Cervical:  Cervical adenopathy present.  Skin:    General: Skin is warm and dry.  Neurological:     General: No focal deficit present.     Mental Status: She is alert and oriented to person, place, and time.  Psychiatric:        Mood and Affect: Mood normal.        Behavior: Behavior normal.      UC Treatments / Results  Labs (all labs ordered are listed, but only abnormal results are displayed) Labs Reviewed  POCT RAPID STREP A, ED / UC    EKG   Radiology No results found.  Procedures Procedures (including critical care time)  Medications Ordered in UC Medications - No data to display  Initial Impression / Assessment and Plan / UC Course  I have reviewed the triage vital signs and the nursing notes.  Pertinent labs & imaging results that were available during my care of the patient were reviewed by me and considered in my medical decision making (see chart for details).  Viral URI with cough  Patient is in no signs of distress nor toxic appearing.  Vital signs are stable.  Low suspicion for pneumonia, pneumothorax or bronchitis and therefore will defer imaging.  Strep test is negative.  Prescribed viscous lidocaine and ibuprofen 800 mg   May use additional over-the-counter medications as needed for supportive care.  May follow-up with urgent care as needed if symptoms persist or worsen.  Note given.   Final Clinical Impressions(s) / UC Diagnoses   Final diagnoses:  None   Discharge Instructions   None    ED Prescriptions   None    PDMP not reviewed this encounter.   Valinda Hoar, [place] 02/02/22 [phone]

## 2022-02-02 NOTE — Discharge Instructions (Addendum)
Your symptoms today are most likely being caused by a virus and should steadily improve in time it can take up to 7 to 10 days before you truly start to see a turnaround however things will get better  Strep test is negative and throat irritation is most likely due to mucous    May gargle and spit out lidocaine solution every 4 hours as needed to provide a temporary relief to your throat  Ibuprofen 800 mg every 8 hours and Tylenol 500 mg together to help manage discomfort  For cough: honey 1/2 to 1 teaspoon (you can dilute the honey in water or another fluid).  You can also use guaifenesin and dextromethorphan for cough. You can use a humidifier for chest congestion and cough.  If you don't have a humidifier, you can sit in the bathroom with the hot shower running.      For sore throat: try warm salt water gargles, cepacol lozenges, throat spray, warm tea or water with lemon/honey, popsicles or ice, or OTC cold relief medicine for throat discomfort.   For congestion: take a daily anti-histamine like Zyrtec, Claritin, and a oral decongestant, such as pseudoephedrine.  You can also use Flonase 1-2 sprays in each nostril daily.   It is important to stay hydrated: drink plenty of fluids (water, gatorade/powerade/pedialyte, juices, or teas) to keep your throat moisturized and help further relieve irritation/discomfort.

## 2022-02-02 NOTE — ED Triage Notes (Signed)
C/O sore throat,neck stiffness and nasal congestion.

## 2022-02-03 ENCOUNTER — Other Ambulatory Visit: Payer: Self-pay

## 2022-02-03 ENCOUNTER — Encounter (HOSPITAL_COMMUNITY): Payer: Self-pay

## 2022-02-03 ENCOUNTER — Emergency Department (HOSPITAL_COMMUNITY)
Admission: EM | Admit: 2022-02-03 | Discharge: 2022-02-03 | Disposition: A | Discharge status: Home / Self Care | Payer: BC Managed Care – PPO | Attending: Emergency Medicine | Admitting: Emergency Medicine

## 2022-02-03 DIAGNOSIS — J029 Acute pharyngitis, unspecified: Secondary | ICD-10-CM | POA: Diagnosis present

## 2022-02-03 NOTE — ED Triage Notes (Addendum)
Sts left sided sore throat x 3-4 days.   Went to UC yesterday for a globus sensation after eating. Says they gave viscous lidocaine, POC strept test and felt her neck. Went home and pain continued.

## 2022-02-03 NOTE — ED Provider Notes (Signed)
[facility]ENC[facility] Provider Note   CSN: [id number] Arrival date & time: 02/03/22  2330     History  Chief Complaint  Patient presents with   Sore Throat    Felicia Edwards is a 31 y.o. female.  Pt complains of a sore throat.  Pt reports she was seen at Urgent care yesterday for the same.  Pt reports soreness in the left side of her neck. Pt has a negative strep test yesterday   The history is provided by the patient. No language interpreter was used.  Sore Throat This is a new problem. The problem occurs constantly. The problem has not changed since onset.Nothing aggravates the symptoms. Nothing relieves the symptoms. She has tried nothing for the symptoms. The treatment provided no relief.       Home Medications Prior to Admission medications   Medication Sig Start Date End Date Taking? Authorizing Provider  HYDROcodone-acetaminophen (NORCO/VICODIN) 5-325 MG tablet Take 2 tablets by mouth every 4 (four) hours as needed. 06/04/19   Eustace Moore, MD  ibuprofen (ADVIL) 800 MG tablet Take 1 tablet (800 mg total) by mouth 3 (three) times daily. 02/02/22   White, Elita Boone, NP  lidocaine (XYLOCAINE) 2 % solution Use as directed 15 mLs in the mouth or throat every 4 (four) hours as needed for mouth pain. 02/02/22   Valinda Hoar, NP  methylPREDNISolone (MEDROL DOSEPAK) 4 MG TBPK tablet TAD 06/04/19   Eustace Moore, MD  omeprazole (PRILOSEC) 20 MG capsule Take 1 capsule (20 mg total) by mouth daily. 10/18/15 06/04/19  Gerhard Munch, MD      Allergies    Patient has no known allergies.    Review of Systems   Review of Systems  HENT:  Positive for sore throat.   All other systems reviewed and are negative.   Physical Exam Updated Vital Signs BP (!) 153/99   Pulse 97   Temp 99.4 F (37.4 C) (Oral)   Resp 18   Ht 5\' 3"  (1.6 m)   Wt 89.8 kg   LMP 01/14/2022 (Approximate)   SpO2 100%   BMI 35.07 kg/m  Physical Exam Vitals and  nursing note reviewed.  Constitutional:      Appearance: She is well-developed.  HENT:     Head: Normocephalic.     Right Ear: Tympanic membrane normal.     Left Ear: Tympanic membrane normal.     Mouth/Throat:     Mouth: Mucous membranes are moist.     Tonsils: No tonsillar exudate or tonsillar abscesses.  Pulmonary:     Effort: Pulmonary effort is normal.  Musculoskeletal:        General: Normal range of motion.     Cervical back: Normal range of motion.  Skin:    General: Skin is warm.  Neurological:     Mental Status: She is alert and oriented to person, place, and time.     ED Results / Procedures / Treatments   Labs (all labs ordered are listed, but only abnormal results are displayed) Labs Reviewed - No data to display  EKG None  Radiology No results found.  Procedures Procedures    Medications Ordered in ED Medications - No data to display  ED Course/ Medical Decision Making/ A&P                           Medical Decision Making Pt complains of left sided throat soreness.  Pt has lidocaine and lozenges.   Amount and/or Complexity of Data Reviewed External Data Reviewed: labs and notes.    Details: Urgent care notes reviewed  strep is negative            Final Clinical Impression(s) / ED Diagnoses Final diagnoses:  Pharyngitis, unspecified etiology    Rx / DC Orders ED Discharge Orders     None     An After Visit Summary was printed and given to the patient.     Elson Areas, PA-C 02/03/22 0350    Melene Plan, DO 02/03/22 0840

## 2022-02-03 NOTE — Discharge Instructions (Signed)
Continue symptomatic care.

## 2022-11-05 ENCOUNTER — Encounter (HOSPITAL_COMMUNITY): Payer: Self-pay

## 2022-11-05 ENCOUNTER — Other Ambulatory Visit: Payer: Self-pay

## 2022-11-05 ENCOUNTER — Emergency Department (HOSPITAL_COMMUNITY): Payer: Medicaid Other

## 2022-11-05 ENCOUNTER — Emergency Department (HOSPITAL_COMMUNITY)
Admission: EM | Admit: 2022-11-05 | Discharge: 2022-11-05 | Discharge status: Against Medical Advice | Payer: Medicaid Other | Attending: Emergency Medicine | Admitting: Emergency Medicine

## 2022-11-05 DIAGNOSIS — Z5321 Procedure and treatment not carried out due to patient leaving prior to being seen by health care provider: Secondary | ICD-10-CM | POA: Insufficient documentation

## 2022-11-05 DIAGNOSIS — M79641 Pain in right hand: Secondary | ICD-10-CM | POA: Diagnosis not present

## 2022-11-05 DIAGNOSIS — Y92410 Unspecified street and highway as the place of occurrence of the external cause: Secondary | ICD-10-CM | POA: Diagnosis not present

## 2022-11-05 DIAGNOSIS — M545 Low back pain, unspecified: Secondary | ICD-10-CM | POA: Insufficient documentation
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
## 2022-11-08 ENCOUNTER — Emergency Department (HOSPITAL_COMMUNITY): Payer: Medicaid Other

## 2022-11-08 ENCOUNTER — Emergency Department (HOSPITAL_COMMUNITY)
Admission: EM | Admit: 2022-11-08 | Discharge: 2022-11-08 | Disposition: A | Discharge status: Home / Self Care | Payer: Medicaid Other | Attending: Emergency Medicine | Admitting: Emergency Medicine

## 2022-11-08 ENCOUNTER — Encounter (HOSPITAL_COMMUNITY): Payer: Self-pay

## 2022-11-08 DIAGNOSIS — M545 Low back pain, unspecified: Secondary | ICD-10-CM | POA: Insufficient documentation

## 2022-11-08 DIAGNOSIS — Y92410 Unspecified street and highway as the place of occurrence of the external cause: Secondary | ICD-10-CM | POA: Diagnosis not present

## 2022-11-08 DIAGNOSIS — M79641 Pain in right hand: Secondary | ICD-10-CM | POA: Diagnosis not present

## 2022-11-08 LAB — POC URINE PREG, ED: Preg Test, Ur: NEGATIVE

## 2022-11-08 MED ORDER — MUPIROCIN CALCIUM 2 % EX CREA: TOPICAL_CREAM | Freq: Once | CUTANEOUS | Status: DC

## 2022-11-08 MED ORDER — LIDOCAINE 5 % EX PTCH
1.0000 | MEDICATED_PATCH | CUTANEOUS | Status: DC
  Administered 2022-11-08: 1 via TRANSDERMAL
  Filled 2022-11-08: qty 1

## 2022-11-08 MED ORDER — METHOCARBAMOL 500 MG PO TABS: 500.0000 mg | ORAL_TABLET | Freq: Two times a day (BID) | ORAL | 0 refills | Status: AC

## 2022-11-08 MED ORDER — MUPIROCIN 2 % EX OINT
TOPICAL_OINTMENT | Freq: Once | CUTANEOUS | Status: AC
  Filled 2022-11-08: qty 22

## 2022-11-08 NOTE — Discharge Instructions (Addendum)
As discussed, your imaging does not show any acute injury to your lower back. You can take Robaxin (a muscle relaxer) up to twice as needed for pain. Continue taking Ibuprofen once a day for inflammation. Wear ACE wrap as needed on right hand for swelling.  Please follow up with your PCP in 3-5 days for reevaluation of your symptoms.  Get help right away if: You have a new injury and your pain is worse or different. You feel numb or you have tingling in the painful area.
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
## 2023-07-04 IMAGING — CT CT HEAD W/O CM
3 series · 15 of 47 positions shown, 18 images · non-contrast
Comparison: None.

CLINICAL DATA: Left-sided headache for the past 4 days.

EXAM:
CT HEAD WITHOUT CONTRAST
TECHNIQUE: Contiguous axial images were obtained from the base of the skull
through the vertex without intravenous contrast.

[Series 2: head wo · axial · 0.43mm/px · z∈[+1556,+1680]mm · 9 of 30 slices shown, 12 images]
[im 3/30  brain]
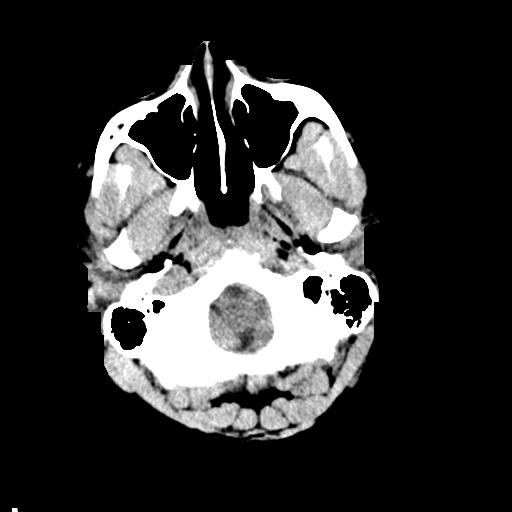
[im 3/30  bone]
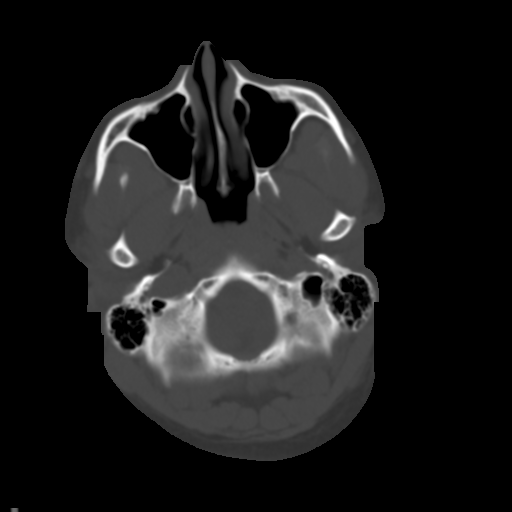
[im 6/30  brain]
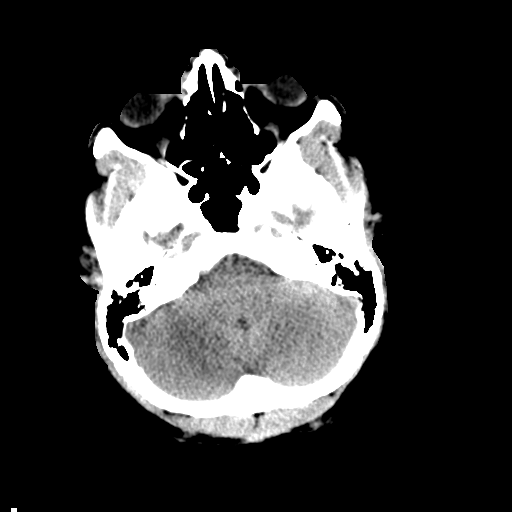
[im 9/30  brain]
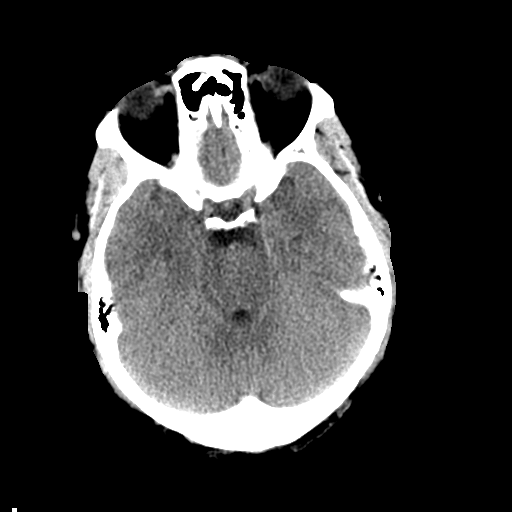
[im 12/30  brain]
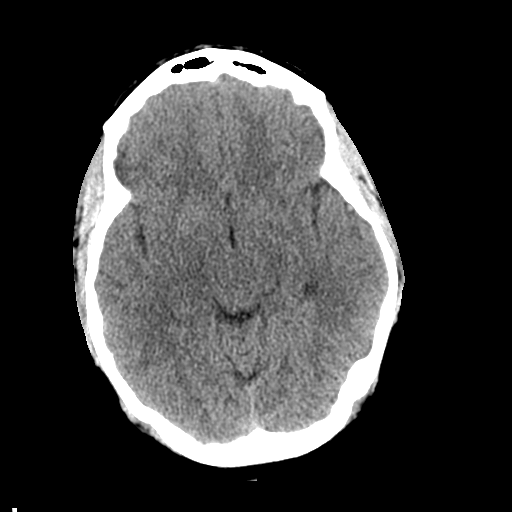
[im 16/30  brain]
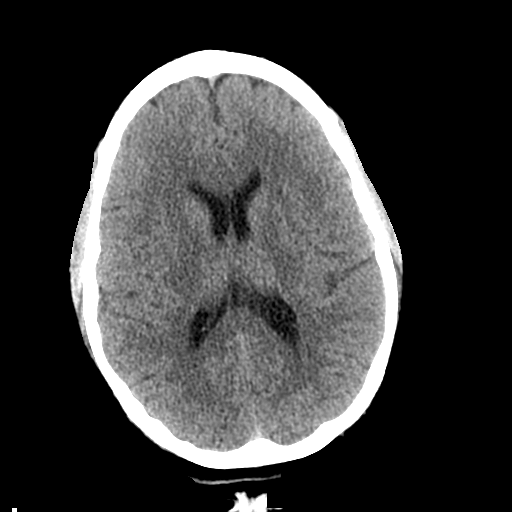
[im 16/30  bone]
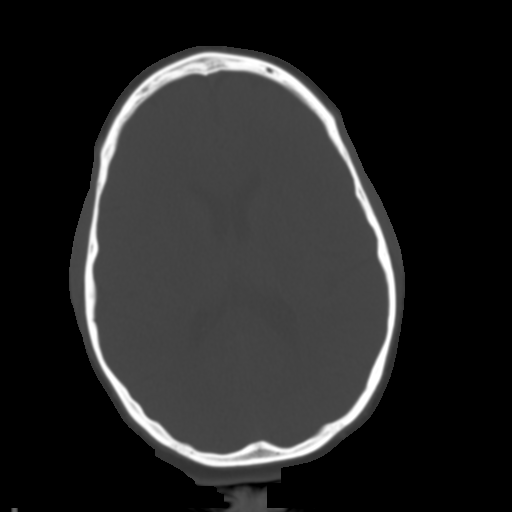
[im 19/30  brain]
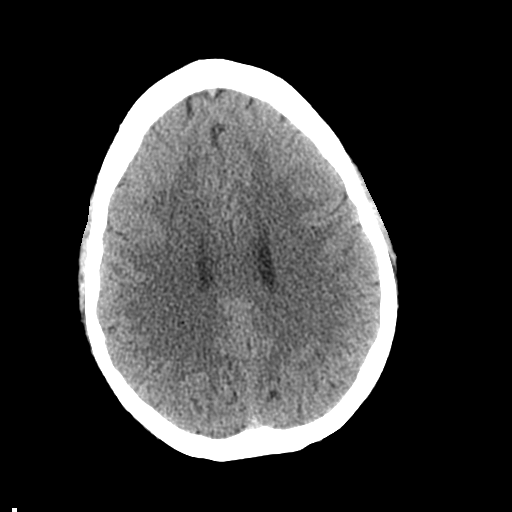
[im 22/30  brain]
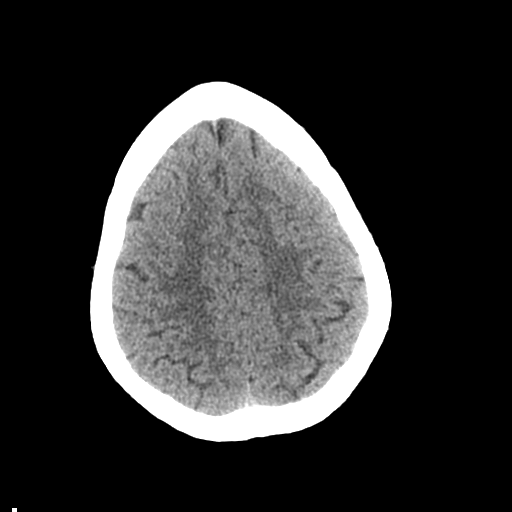
[im 25/30  brain]
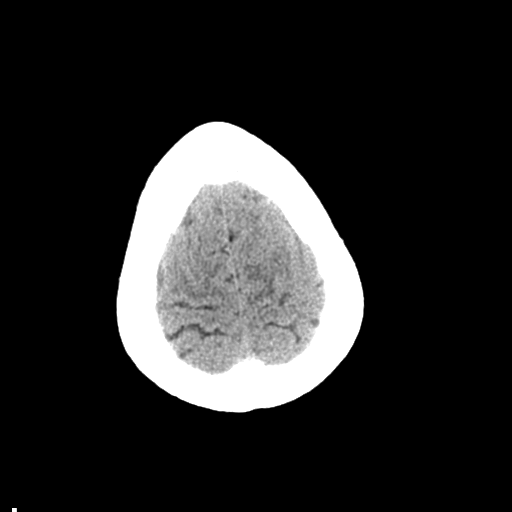
[im 28/30  brain]
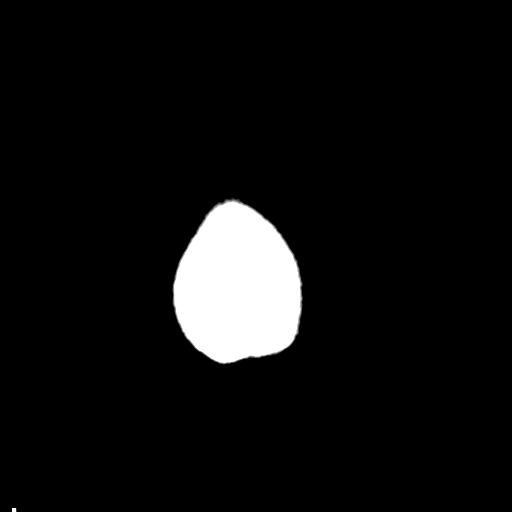
[im 28/30  bone]
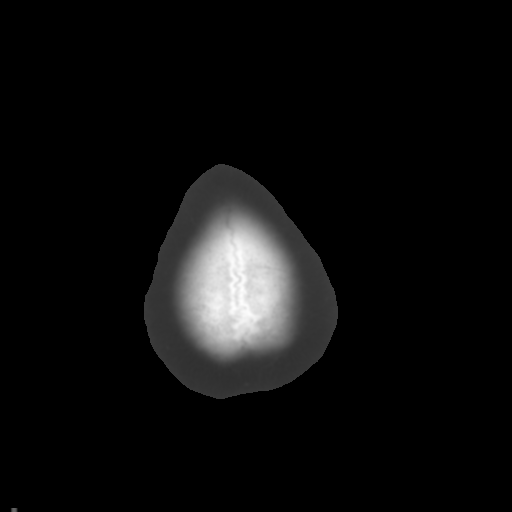

[Series 4: coronal soft tissue · coronal · 0.31mm/px · 3 of 67 slices shown]
[im 23/67  brain]
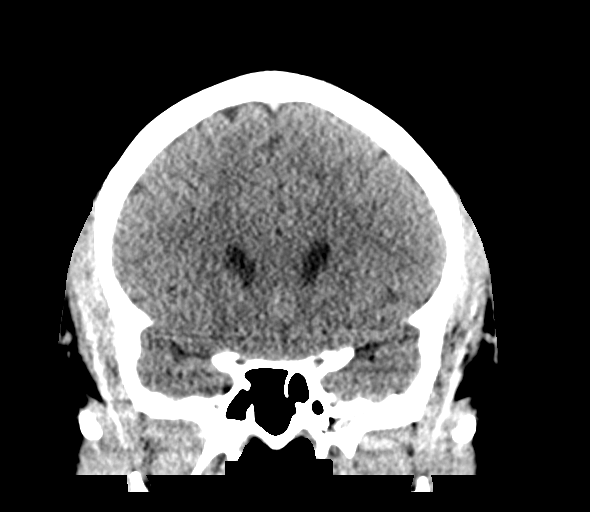
[im 30/67  brain]
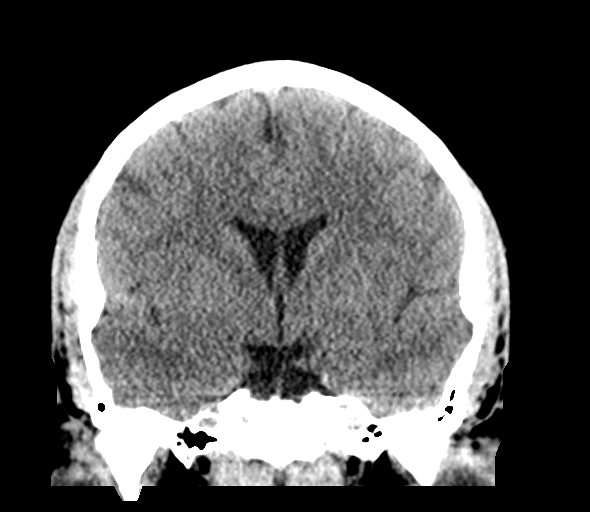
[im 37/67  brain]
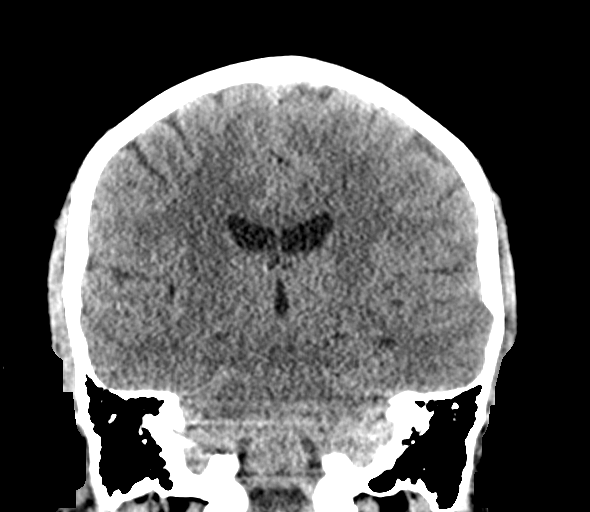

[Series 5: sagittal soft tissue · sagittal · 0.31mm/px · 3 of 61 slices shown]
[im 21/61  brain]
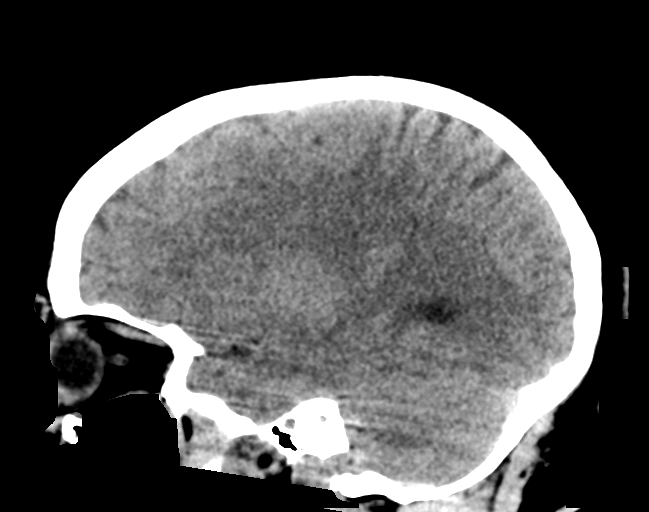
[im 31/61  brain]
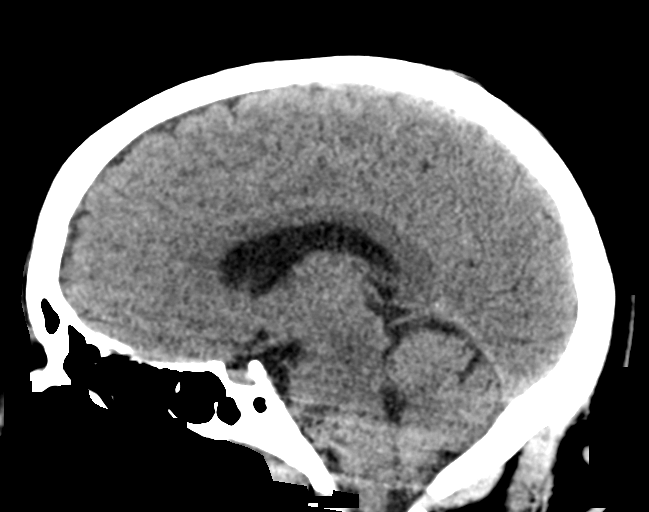
[im 41/61  brain]
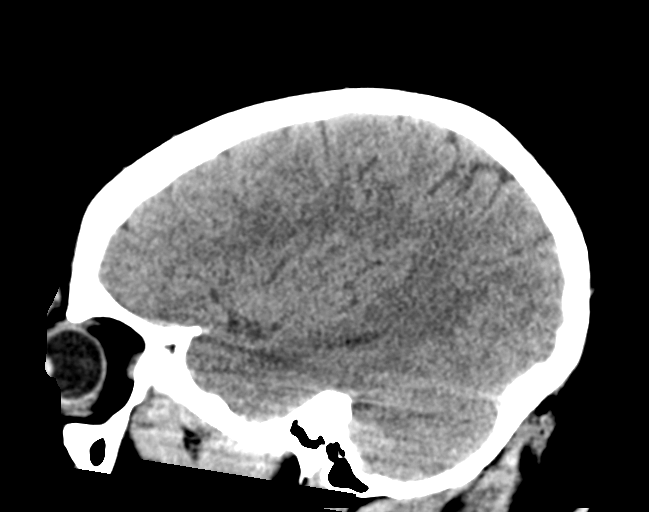

[15 of 47 positions shown; findings below may reference images not displayed]

FINDINGS: Brain: No evidence of acute infarction, hemorrhage, hydrocephalus,
extra-axial collection or mass lesion/mass effect.

Vascular: No hyperdense vessel or unexpected calcification.

Skull: Normal. Negative for fracture or focal lesion.

Sinuses/Orbits: No acute finding.

Other: None.
IMPRESSION: Normal noncontrast head CT.

## 2023-08-06 ENCOUNTER — Encounter: Admitting: Obstetrics & Gynecology

## 2023-08-23 ENCOUNTER — Ambulatory Visit: Admitting: Obstetrics and Gynecology

## 2023-08-23 ENCOUNTER — Encounter: Payer: Self-pay | Admitting: Obstetrics and Gynecology

## 2023-08-23 ENCOUNTER — Other Ambulatory Visit (HOSPITAL_COMMUNITY)
Admission: RE | Admit: 2023-08-23 | Discharge: 2023-08-23 | Disposition: A | Discharge status: Home / Self Care | Source: Ambulatory Visit | Attending: Obstetrics and Gynecology | Admitting: Obstetrics and Gynecology

## 2023-08-23 VITALS — BP 134/83 | HR 83 | Ht 63.0 in | Wt 204.0 lb

## 2023-08-23 DIAGNOSIS — Z113 Encounter for screening for infections with a predominantly sexual mode of transmission: Secondary | ICD-10-CM | POA: Insufficient documentation

## 2023-08-23 DIAGNOSIS — Z01419 Encounter for gynecological examination (general) (routine) without abnormal findings: Secondary | ICD-10-CM

## 2023-08-23 DIAGNOSIS — E282 Polycystic ovarian syndrome: Secondary | ICD-10-CM | POA: Diagnosis not present

## 2023-08-23 DIAGNOSIS — Z124 Encounter for screening for malignant neoplasm of cervix: Secondary | ICD-10-CM

## 2023-08-23 NOTE — Progress Notes (Signed)
 ANNUAL GYNECOLOGY VISIT Chief Complaint  Patient presents with   NEW PATIENT/GYN     Subjective:  Felicia Edwards is a 33 y.o. G1P1 who presents for annual exam.  Doing well. Hx PCOS but reports that periods have been regular since she changed her diet. Has lost weight and has been feeling great!  Gyn History: Patient's last menstrual period was 08/01/2023 (approximate). Sexually active: not at this time Contraception: abstinence History of STIs: Yes: treated Last pap: No results found for: DIAGPAP, HPV, HPVHIGH History of abnormal pap: Yes: no procedures or biopsies Periods: regular   Upstream - 08/23/23 0945       Pregnancy Intention Screening   Does the patient want to become pregnant in the next year? No    Does the patient's partner want to become pregnant in the next year? No    Would the patient like to discuss contraceptive options today? No      Contraception Wrap Up   Current Method Abstinence;Female Condom         The pregnancy intention screening data noted above was reviewed. Potential methods of contraception were discussed. The patient elected to proceed with No data recorded.       08/23/2023    9:44 AM  Depression screen PHQ 2/9  Decreased Interest 0  Down, Depressed, Hopeless 0  PHQ - 2 Score 0  Altered sleeping 0  Tired, decreased energy 0  Change in appetite 0  Feeling bad or failure about yourself  0  Trouble concentrating 0  Moving slowly or fidgety/restless 0  Suicidal thoughts 0  PHQ-9 Score 0  Difficult doing work/chores Not difficult at all        08/23/2023    9:44 AM  GAD 7 : Generalized Anxiety Score  Nervous, Anxious, on Edge 0  Control/stop worrying 0  Worry too much - different things 0  Trouble relaxing 0  Restless 0  Easily annoyed or irritable 0  Afraid - awful might happen 0  Total GAD 7 Score 0  Anxiety Difficulty Not difficult at all      OB History     Gravida  1   Para  1   Term       Preterm      AB      Living  1      SAB      IAB      Ectopic      Multiple      Live Births  1           Past Medical History:  Diagnosis Date   Pregnancy induced hypertension    Vaginal Pap smear, abnormal     Past Surgical History:  Procedure Laterality Date   NO PAST SURGERIES      Social History   Socioeconomic History   Marital status: Single    Spouse name: Not on file   Number of children: 1   Years of education: Not on file   Highest education level: Not on file  Occupational History   Not on file  Tobacco Use   Smoking status: Some Days    Current packs/day: 1.00    Types: Cigarettes   Smokeless tobacco: Current  Vaping Use   Vaping status: Some Days  Substance and Sexual Activity   Alcohol use: Yes    Comment: occasionally   Drug use: No   Sexual activity: Not Currently    Birth control/protection: None  Other Topics Concern  Not on file  Social History Narrative   Not on file   Social Drivers of Health   Financial Resource Strain: Not on file  Food Insecurity: Not on file  Transportation Needs: Not on file  Physical Activity: Not on file  Stress: Not on file  Social Connections: Unknown (07/25/2021)   Received from [facility]   Social Network    Social Network: Not on file    Family History  Problem Relation Age of Onset   Diabetes Father     Current Outpatient Medications on File Prior to Visit  Medication Sig Dispense Refill   HYDROcodone -acetaminophen  (NORCO/VICODIN) 5-325 MG tablet Take 2 tablets by mouth every 4 (four) hours as needed. (Patient not taking: Reported on 08/23/2023) 10 tablet 0   ibuprofen  (ADVIL ) 800 MG tablet Take 1 tablet (800 mg total) by mouth 3 (three) times daily. (Patient not taking: Reported on 08/23/2023) 21 tablet 0   lidocaine  (XYLOCAINE ) 2 % solution Use as directed 15 mLs in the mouth or throat every 4 (four) hours as needed for mouth pain. (Patient not taking: Reported on 08/23/2023) 100  mL 0   methylPREDNISolone  (MEDROL  DOSEPAK) 4 MG TBPK tablet TAD (Patient not taking: Reported on 08/23/2023) 21 tablet 0   [DISCONTINUED] omeprazole  (PRILOSEC) 20 MG capsule Take 1 capsule (20 mg total) by mouth daily. 21 capsule 0   No current facility-administered medications on file prior to visit.    No Known Allergies   Objective:   Vitals:   08/23/23 0914  BP: 134/83  Pulse: 83  Weight: 204 lb (92.5 kg)  Height: 5' 3 (1.6 m)   Physical Examination:   General appearance - well appearing, and in no distress  Mental status - alert, oriented to person, place, and time  Psych:  normal mood and affect  Skin - warm and dry, normal color, no suspicious lesions noted  Chest - effort normal  Heart - normal rate    Neck:  midline trachea, no thyromegaly or nodules  Breasts - breasts appear normal, no suspicious masses, no skin or nipple changes or  axillary nodes  Abdomen - soft, nontender, nondistended, no masses or organomegaly  Pelvic -  VULVA: normal appearing vulva with no masses, tenderness or lesions   VAGINA: normal appearing vagina with normal color and discharge, no lesions   CERVIX: normal appearing cervix without discharge or lesions, no CMT  Thin prep pap is done with HR HPV cotesting  UTERUS: uterus is felt to be normal size, shape, consistency and nontender   ADNEXA: No adnexal masses or tenderness noted.  Extremities:  No swelling or varicosities noted  Chaperone present for exam  Assessment and Plan:  1. Women's annual routine gynecological examination [Z01.419] (Primary) Pap Normal clinical breast exam, reviewed self breast exams, mammograms to start at age 68  STI swab, reports negative testing last week for other STIs Declines contraception, not sexually active at this time, reviewed condoms and emergency contraception  2. Cervical cancer screening - Cytology - PAP( [place])  3. Routine screening for STI (sexually transmitted infection) -  Cervicovaginal ancillary only( [place])  4. PCOS (polycystic ovarian syndrome) Continue excellent dietary changes and exercise Reports that her PCP does A1c and cholesterol screening   No follow-ups on file.  No future appointments.  Marci Setter, MD, FACOG Obstetrician & Gynecologist, [facility] for [facility], [facility]

## 2023-08-23 NOTE — Progress Notes (Signed)
 33 y.o. New GYN presents for AEX/PAP/STD screening.

## 2023-08-26 ENCOUNTER — Ambulatory Visit: Payer: Self-pay | Admitting: Obstetrics and Gynecology

## 2023-08-26 LAB — CERVICOVAGINAL ANCILLARY ONLY
Chlamydia: NEGATIVE
Comment: NEGATIVE
Comment: NEGATIVE
Comment: NORMAL
Neisseria Gonorrhea: NEGATIVE
Trichomonas: NEGATIVE

## 2023-08-28 LAB — CYTOLOGY - PAP
Comment: NEGATIVE
Comment: NEGATIVE
Comment: NEGATIVE
HPV 16: POSITIVE — AB
HPV 18 / 45: NEGATIVE
High risk HPV: POSITIVE — AB

## 2023-09-10 ENCOUNTER — Other Ambulatory Visit (HOSPITAL_COMMUNITY)
Admission: RE | Admit: 2023-09-10 | Discharge: 2023-09-10 | Disposition: A | Discharge status: Home / Self Care | Source: Ambulatory Visit | Attending: Obstetrics and Gynecology | Admitting: Obstetrics and Gynecology

## 2023-09-10 ENCOUNTER — Ambulatory Visit (INDEPENDENT_AMBULATORY_CARE_PROVIDER_SITE_OTHER): Admitting: Obstetrics and Gynecology

## 2023-09-10 ENCOUNTER — Encounter: Payer: Self-pay | Admitting: Obstetrics and Gynecology

## 2023-09-10 VITALS — BP 143/89 | HR 83 | Ht 63.0 in | Wt 194.0 lb

## 2023-09-10 DIAGNOSIS — R87612 Low grade squamous intraepithelial lesion on cytologic smear of cervix (LGSIL): Secondary | ICD-10-CM

## 2023-09-10 DIAGNOSIS — R87810 Cervical high risk human papillomavirus (HPV) DNA test positive: Secondary | ICD-10-CM | POA: Diagnosis not present

## 2023-09-10 DIAGNOSIS — Z3202 Encounter for pregnancy test, result negative: Secondary | ICD-10-CM

## 2023-09-10 DIAGNOSIS — B977 Papillomavirus as the cause of diseases classified elsewhere: Secondary | ICD-10-CM

## 2023-09-10 NOTE — Progress Notes (Signed)
 Pt presents for colposcopy. No concerns

## 2023-09-10 NOTE — Progress Notes (Signed)
 Colposcopy Procedure Note  Felicia Edwards is a 33 y.o. G1P1001 here for colposcopy.  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    The risks (including infection, bleeding, pain) and benefits of the procedure were explained to the patient and written informed consent was obtained.  The patient was placed in the dorsal lithotomy position. A Graves was speculum inserted in the vagina, and the cervix was visualized.  The cervix was stained with acetic acid and visualized using the colposcope under magnification as well as with a green filter. Findings as below. Cervical biopsies were taken at 6, 9, 12 o'clock. Endocervical curettage then performed in all four quadrants. Small amount of bleeding noted that improved with pressure. Monsel's solution applied with good hemostasis noted. Patient tolerating procedure well.  Findings: acetowhite at 9, punctate spots at 6, increased glandularity at 12  Impression: low grade  Adequate: yes  Specimens:  Cervical biopsy at 6, 9, 12 o'clock Endocervical curettage  Condition: Stable  Complications: None  Plan: The patient was advised to call for any fever or for prolonged or severe pain or bleeding. She was advised to use OTC analgesics as needed for mild to moderate pain. She was advised to avoid vaginal intercourse for 48 hours or until the bleeding has completely stopped.  Will base further management on results of biopsy.   LOIS Yolanda Moats, MD, [facility] Attending Center for [employer] [facility])

## 2023-09-12 LAB — SURGICAL PATHOLOGY

## 2023-09-16 ENCOUNTER — Ambulatory Visit: Payer: Self-pay | Admitting: Family Medicine

## 2023-10-02 ENCOUNTER — Encounter: Admitting: Obstetrics and Gynecology

## 2023-10-10 ENCOUNTER — Encounter: Admitting: Obstetrics and Gynecology

## 2023-10-29 ENCOUNTER — Encounter: Admitting: Obstetrics and Gynecology

## 2023-11-12 ENCOUNTER — Ambulatory Visit: Admitting: Obstetrics and Gynecology

## 2023-11-12 VITALS — BP 142/87 | HR 84 | Ht 63.0 in | Wt 188.0 lb

## 2023-11-12 DIAGNOSIS — R87613 High grade squamous intraepithelial lesion on cytologic smear of cervix (HGSIL): Secondary | ICD-10-CM | POA: Diagnosis not present

## 2023-11-12 DIAGNOSIS — Z01812 Encounter for preprocedural laboratory examination: Secondary | ICD-10-CM | POA: Diagnosis not present

## 2023-11-12 LAB — POCT URINE PREGNANCY: Preg Test, Ur: NEGATIVE

## 2023-11-12 NOTE — Progress Notes (Signed)
    GYNECOLOGY CLINIC COLPOSCOPY PROCEDURE NOTE  33 y.o. G1P1001 here for colposcopy for pap finding of:  Result Date Procedure Results Follow-ups  09/10/2023 Surgical pathology( [place]/ [facility]) SURGICAL PATHOLOGY: SURGICAL PATHOLOGY CASE: [id number] PATIENT: [facility] Surgical Pathology Report     Clinical History: LGSIL (cm)     FINAL MICROSCOPIC DIAGNOSIS:  A. CERVIX, 6, 9 AND 12 O'CLOCK, BIOPSY: - High-grade squamous intraepithelial le...   08/23/2023 Cytology - PAP( [place]) High risk HPV: Positive (A) HPV 16: Positive (A) HPV 18 / 45: Negative Adequacy: Satisfactory for evaluation; transformation zone component PRESENT. Diagnosis: - Low grade squamous intraepithelial lesion (LSIL) (A) Comment: Normal Reference Range HPV - Negative Comment: Normal Reference Range HPV 16- Negative Comment: Normal Reference Range HPV 16 18 45 -Negative     Discussed role for HPV in cervical dysplasia, need for surveillance, nature of the procedure, and risks and benefits.  Pregnancy test: Lab Results  Component Value Date   PREGTESTUR Negative 11/12/2023    No Known Allergies  Patient given informed consent, signed copy in the chart, time out was performed.    Placed in lithotomy position. Cervix viewed with speculum and colposcope after application of acetic acid and lugol's solution   Colposcopy Adequacy Cervix fully visualized: Yes  SCJ fully visualized: Yes  Colposcopy Findings no visible lesions, no mosaicism, no punctation, and no abnormal vasculature  Corresponding biopsies were not obtained.    ECC specimen was not obtained.    Hemostatic measures: None  Complications: none  Patient tolerated the procedure well.  OBGyn Exam  Colposcopy Impressions Normal  Plan No nonstaining areas or abnormal areas detected with acetic acid or lugol's solution. No discernable area to perform LEEP.  Pt will return in 4 months for repeat  pap/colposcopy for surveillance.  Patient was given post procedure instructions.  Will follow up pathology and manage accordingly; patient will be contacted with results and recommendations.  Routine preventative health maintenance measures emphasized.   Jerilynn DELENA Buddle, MD

## 2023-11-12 NOTE — Progress Notes (Signed)
 Pt presents for leep procedure. Pt has no questions or concerns at this time.

## 2023-11-18 ENCOUNTER — Encounter: Admitting: Obstetrics and Gynecology

## 2024-03-06 ENCOUNTER — Encounter (HOSPITAL_COMMUNITY): Payer: Self-pay

## 2024-03-06 ENCOUNTER — Emergency Department (HOSPITAL_COMMUNITY)
Admission: EM | Admit: 2024-03-06 | Discharge: 2024-03-06 | Disposition: A | Discharge status: Home / Self Care | Attending: Emergency Medicine | Admitting: Emergency Medicine

## 2024-03-06 ENCOUNTER — Other Ambulatory Visit: Payer: Self-pay

## 2024-03-06 DIAGNOSIS — J069 Acute upper respiratory infection, unspecified: Secondary | ICD-10-CM | POA: Insufficient documentation

## 2024-03-06 DIAGNOSIS — J029 Acute pharyngitis, unspecified: Secondary | ICD-10-CM | POA: Diagnosis present

## 2024-03-06 LAB — GROUP A STREP BY PCR: Group A Strep by PCR: NOT DETECTED

## 2024-03-06 NOTE — ED Triage Notes (Signed)
 Pt woke up with sore throat, c/o left ear pain.

## 2024-03-06 NOTE — ED Provider Notes (Signed)
 " [place] EMERGENCY DEPARTMENT AT [facility] Provider Note   CSN: [id number] Arrival date & time: 03/06/24  1710     Patient presents with: Sore Throat   Felicia Edwards is a 33 y.o. female who presents with primary concern of sore throat.  Has been using over-the-counter pain medication such as Tylenol  without any relief, also complains of left-sided ear pain.  Symptoms began yesterday evening and have persisted despite her over-the-counter management hence her presentation to the ED today for assessment.  She denies have any shortness of breath, does have occasional cough which is nonproductive, does endorse congestion.    Sore Throat       Prior to Admission medications  Medication Sig Start Date End Date Taking? Authorizing Provider  omeprazole  (PRILOSEC) 20 MG capsule Take 1 capsule (20 mg total) by mouth daily. 10/18/15 06/04/19  Garrick Charleston, MD    Allergies: Patient has no known allergies.    Review of Systems  HENT:  Positive for congestion and sore throat.   All other systems reviewed and are negative.   Updated Vital Signs BP (!) 158/89 (BP Location: Left Arm)   Pulse 87   Temp 98.9 F (37.2 C) (Oral)   Resp 16   Ht 5' 3 (1.6 m)   Wt 90.7 kg   SpO2 99%   BMI 35.43 kg/m   Physical Exam Vitals and nursing note reviewed.  Constitutional:      General: She is not in acute distress.    Appearance: Normal appearance.  HENT:     Head: Normocephalic and atraumatic.     Right Ear: Tympanic membrane and ear canal normal.     Left Ear: Tympanic membrane and ear canal normal.     Nose: Congestion and rhinorrhea present.     Mouth/Throat:     Mouth: Mucous membranes are moist.     Pharynx: Oropharynx is clear. Posterior oropharyngeal erythema present.     Tonsils: No tonsillar exudate. 1+ on the right. 1+ on the left.  Eyes:     Extraocular Movements: Extraocular movements intact.     Conjunctiva/sclera: Conjunctivae normal.     Pupils:  Pupils are equal, round, and reactive to light.  Cardiovascular:     Rate and Rhythm: Normal rate and regular rhythm.     Pulses: Normal pulses.     Heart sounds: Normal heart sounds. No murmur heard.    No friction rub. No gallop.  Pulmonary:     Effort: Pulmonary effort is normal.     Breath sounds: Normal breath sounds.  Abdominal:     General: Abdomen is flat. Bowel sounds are normal.     Palpations: Abdomen is soft.  Musculoskeletal:        General: Normal range of motion.     Cervical back: Normal range of motion and neck supple.     Right lower leg: No edema.     Left lower leg: No edema.  Skin:    General: Skin is warm and dry.     Capillary Refill: Capillary refill takes less than 2 seconds.  Neurological:     General: No focal deficit present.     Mental Status: She is alert and oriented to person, place, and time. Mental status is at baseline.  Psychiatric:        Mood and Affect: Mood normal.     (all labs ordered are listed, but only abnormal results are displayed) Labs Reviewed  GROUP A STREP BY  PCR    EKG: None  Radiology: No results found.   Procedures   Medications Ordered in the ED - No data to display                                  Medical Decision Making  Secondary to this patient's complaints as well as physical exam a strep swab was obtained which is negative.  Consider possible streptococcal pharyngitis or with the negative strep swab in the erythematous posterior oropharynx with lack of exudates on the tonsils does not support this.  Consider viral etiology due to congestion, cough, sore throat, however given the timeline of symptoms nasopharyngeal swab would not be of benefit as this will not alter the management at this time.  Discussed symptomatic management with the patient which he understands and agrees.  Given there are no other concerning findings on the physical evaluation and the patient is stable will discharge with outpatient  follow-up.     Final diagnoses:  Viral pharyngitis  Viral URI with cough    ED Discharge Orders     None          Myriam Dorn BROCKS, PA 03/06/24 2103    Mannie Pac T, DO 03/08/24 1532  "

## 2024-03-06 NOTE — Discharge Instructions (Signed)
 Continue to use over-the-counter medications for pain control, also use Hall's drops as well as Cepacol drops for throat pain, was can be easily obtained at your pharmacy or other store.

## 2024-04-21 ENCOUNTER — Ambulatory Visit: Admitting: Obstetrics

## 2024-04-21 ENCOUNTER — Ambulatory Visit: Payer: Self-pay | Admitting: Obstetrics
